# Patient Record
Sex: Male | Born: 1965 | Hispanic: No | Marital: Married | Smoking: Current some day smoker
Health system: Southern US, Community
[De-identification: ages and names within clinical notes are randomized; demographics above are authoritative.]

## PROBLEM LIST (undated history)

## (undated) DIAGNOSIS — I10 Essential (primary) hypertension: Secondary | ICD-10-CM

## (undated) HISTORY — DX: Essential (primary) hypertension: I10

---

## 2005-07-24 ENCOUNTER — Ambulatory Visit: Payer: Self-pay | Admitting: *Deleted

## 2005-07-26 ENCOUNTER — Encounter (HOSPITAL_COMMUNITY): Admission: RE | Admit: 2005-07-26 | Discharge: 2005-08-25 | Payer: Self-pay | Admitting: *Deleted

## 2005-07-26 ENCOUNTER — Ambulatory Visit: Payer: Self-pay | Admitting: Cardiology

## 2005-09-18 ENCOUNTER — Ambulatory Visit (HOSPITAL_COMMUNITY): Admission: RE | Admit: 2005-09-18 | Discharge: 2005-09-18 | Payer: Self-pay | Admitting: Pulmonary Disease

## 2007-02-17 ENCOUNTER — Ambulatory Visit: Payer: Self-pay | Admitting: Cardiology

## 2007-02-17 LAB — CONVERTED CEMR LAB
Chloride: 105 meq/L (ref 96–112)
Creatinine, Ser: 1.1 mg/dL (ref 0.4–1.5)
Eosinophils Absolute: 0.3 10*3/uL (ref 0.0–0.6)
MCHC: 33.9 g/dL (ref 30.0–36.0)
MCV: 88.1 fL (ref 78.0–100.0)
Monocytes Absolute: 0.7 10*3/uL (ref 0.2–0.7)
Monocytes Relative: 9 % (ref 3.0–11.0)
Neutro Abs: 3.8 10*3/uL (ref 1.4–7.7)
Potassium: 3.9 meq/L (ref 3.5–5.1)
RBC: 5.07 M/uL (ref 4.22–5.81)
Sodium: 139 meq/L (ref 135–145)
aPTT: 28.5 s (ref 21.7–29.8)

## 2007-02-20 ENCOUNTER — Inpatient Hospital Stay (HOSPITAL_BASED_OUTPATIENT_CLINIC_OR_DEPARTMENT_OTHER): Admission: RE | Admit: 2007-02-20 | Discharge: 2007-02-20 | Payer: Self-pay | Admitting: Cardiology

## 2007-02-20 ENCOUNTER — Ambulatory Visit: Payer: Self-pay | Admitting: Cardiology

## 2007-03-03 ENCOUNTER — Ambulatory Visit: Payer: Self-pay | Admitting: Cardiology

## 2007-03-03 LAB — CONVERTED CEMR LAB
ALT: 63 units/L — ABNORMAL HIGH (ref 0–53)
AST: 32 units/L (ref 0–37)
Alkaline Phosphatase: 58 units/L (ref 39–117)
Bilirubin, Direct: 0.1 mg/dL (ref 0.0–0.3)
LDL Cholesterol: 69 mg/dL (ref 0–99)
Total CHOL/HDL Ratio: 4.8
Total CK: 335 units/L (ref 7–195)
Total Protein: 7.4 g/dL (ref 6.0–8.3)
Triglycerides: 180 mg/dL — ABNORMAL HIGH (ref 0–149)
VLDL: 36 mg/dL (ref 0–40)

## 2008-10-17 ENCOUNTER — Encounter: Admission: RE | Admit: 2008-10-17 | Discharge: 2008-10-17 | Payer: Self-pay | Admitting: Family Medicine

## 2009-03-28 ENCOUNTER — Ambulatory Visit (HOSPITAL_COMMUNITY): Payer: Self-pay | Admitting: Psychiatry

## 2009-09-21 ENCOUNTER — Ambulatory Visit: Payer: Self-pay | Admitting: Psychiatry

## 2009-09-21 ENCOUNTER — Inpatient Hospital Stay (HOSPITAL_COMMUNITY): Admission: RE | Admit: 2009-09-21 | Discharge: 2009-09-25 | Payer: Self-pay | Admitting: Psychiatry

## 2010-05-04 LAB — DIFFERENTIAL
Basophils Absolute: 0.1 10*3/uL (ref 0.0–0.1)
Lymphs Abs: 2.8 10*3/uL (ref 0.7–4.0)
Neutro Abs: 3.6 10*3/uL (ref 1.7–7.7)

## 2010-05-04 LAB — COMPREHENSIVE METABOLIC PANEL
ALT: 49 U/L (ref 0–53)
AST: 35 U/L (ref 0–37)
Albumin: 4.5 g/dL (ref 3.5–5.2)
Chloride: 104 mEq/L (ref 96–112)
Creatinine, Ser: 1.16 mg/dL (ref 0.4–1.5)
GFR calc Af Amer: >60 mL/min (ref >60)
Sodium: 138 mEq/L (ref 135–145)
Total Protein: 7.3 g/dL (ref 6.0–8.3)

## 2010-05-04 LAB — TSH: TSH: 3.507 u[IU]/mL (ref 0.350–4.500)

## 2010-05-04 LAB — CBC
Hemoglobin: 15.2 g/dL (ref 13.0–17.0)
MCHC: 34.6 g/dL (ref 30.0–36.0)
MCV: 86.6 fL (ref 78.0–100.0)
Platelets: 253 10*3/uL (ref 150–400)
WBC: 7.6 10*3/uL (ref 4.0–10.5)

## 2010-07-03 NOTE — Assessment & Plan Note (Signed)
[facility]                            CARDIOLOGY OFFICE NOTE   TIMM, BONENBERGER                           MRN:          [id number]  DATE:03/03/2007                            DOB:          Jan 24, 1966    Mr. Placeres returns today after having a cardiac catheterization.  His  catheterization showed normal coronary arteries and normal left  ventricular function.   He has not eaten today and would like his lipids checked.  He was told  several months ago that his CPK enzyme was up, but he had a fall at a  friend's house.  He also was told that his liver enzymes were up of bit.   CURRENT MEDICATIONS:  1. Lipitor 40 mg q.h.s.  2. Norvasc 10 mg a day.  3. Allopurinol 300 mg a day.  4. Multivitamins.  5. Vitamin D.  6. Lexapro 10 mg a day.  (He stopped his aspirin appropriately.)   PHYSICAL EXAMINATION:  VITAL SIGNS:  His blood pressure is good at  126/85, his pulse is 63 and regular, his weight is 197.  GROIN:  His right groin is stable.  He has a small knot there but no  bruit.  EXTREMITIES:  Distal pulses are intact.   ASSESSMENT/PLAN:  I have made him a copy of his cath report and his  blood work pre cath.  I have also given him primary prevention  guidelines.  Will check his blood work today and will give him a call.  I will see him back in a year p.r.n.     Thomas C. Daleen Squibb, MD, [facility]  Electronically Signed    TCW/MedQ  DD: 03/03/2007  DT: 03/03/2007  Job #: [id number]   cc:   Lionel December, M.D.

## 2010-07-03 NOTE — Cardiovascular Report (Signed)
NAMEDUSTEN, ELLINWOOD NO.:  [id number]   MEDICAL RECORD NO.:  [id number]          PATIENT TYPE:  OIB   LOCATION:  1961                         FACILITY:  [facility]   PHYSICIAN:  Rollene Rotunda, MD, FACCDATE OF BIRTH:  05-10-1965   DATE OF PROCEDURE:  DATE OF DISCHARGE:                            CARDIAC CATHETERIZATION   PRIMARY DOCTOR:  Lionel December, M.D.   CARDIOLOGIST:  Jesse Sans. Wall, MD, [facility]   PROCEDURE:  Left heart catheterization/coronary arteriography.   INDICATIONS:  A patient with chest discomfort and a Cardiolite  suggesting anterior wall ischemia.   PROCEDURE NOTE:  Left heart catheterization performed via right femoral  artery.  The artery is cannulated using anterior wall puncture.  A #4-  [place] arterial sheath was inserted via the Seldinger technique.  Preformed Judkins and pigtail catheter utilized.  The patient tolerated  procedure well and left the lab in stable condition.   RESULTS:  Hemodynamics:  LV 123/15, AO 121/97.  Coronaries:  Left main  was normal.  The LAD wrapped the apex but was otherwise normal.  First  through fourth diagonals were small and normal.  The circumflex was a  dominant vessel.  OM-1 was large branching and normal.  Posterolateral  was large branching and normal.  The PDA was moderate-sized and normal.  The right coronary artery is nondominant.  It was small and normal.  Left ventriculogram:  The left ventriculogram was obtained in the RAO  projection.  The EF 65% with normal wall motion.   CONCLUSION:  Normal coronaries.  Normal left ventricular function.   PLAN:  No further cardiac workup is suggested.  Continue with primary  risk reduction.      Rollene Rotunda, MD, [facility]  Electronically Signed     JH/MEDQ  D:  02/20/2007  T:  02/20/2007  Job:  [id number]   cc:   Lionel December, M.D.  Thomas C. Wall, MD, [facility]

## 2010-07-03 NOTE — Assessment & Plan Note (Signed)
[facility]                            CARDIOLOGY OFFICE NOTE   MOHSIN, CRUM                           MRN:          [id number]  DATE:02/18/2007                            DOB:          1965/09/07    I was asked by Dr. Lionel December to see his brother-in-law, Symon Norwood,  today for chest pain and positive stress nuclear study.   HISTORY OF PRESENT ILLNESS:  Mr. Patrick Duffy is a delightful 45 year old [place]  male who I have known in the past when I took care of his father.  His  father died of heart failure several years ago in [place].   At that time he was under a lot of stress with their family business,  [address], out of [employer].  He was in [place] for a few  months and smoked for those months.  Other than that he has never  smoked.  During that time he noticed at times a chest fullness or  heaviness and tension-like.  It went into his shoulders and his arms.  It is not exertion related.   He told one of his customers from [place] about this.  They were  in [place] at the time, and he saw Dr. Joanie Coddington.  He performed an  exercise rest stress Myoview study.   He exercised for a total of 10 minutes and 30 seconds with a heart rate  of 161 which was 88% of his age predicted maximum rate.  His MET level  achieved was 12.5.  He had no chest pain or ST segment changes.   His EF was 71% with exertion 60% at rest.   His stress images showed myocardial ischemia with redistribution in the  proximal and midseptum suggestive of myocardial ischemia.   His cardiac risk factors include hypertension, hyperlipidemia.  His  father also had myocardial infarction in his early 55's.   CURRENT MEDICATIONS:  1. Lipitor 40 mg q.h.s.  2. Norvasc 10 mg a day.  3. Allopurinol 300 mg a day.  4. Aspirin 81 mg a day.  5. Multivitamin daily.  6. Vitamin D 1000 mg a day.  7. Lexapro 10 mg a day.   A couple years ago he was having some shortness of  breath.  He had a  chest CT which showed no evidence of pulmonary emboli and no acute  thoracic abnormalities.  He also had pulmonary function studies which  were normal with Dr. Shaune Pollack in [place].  At that time he also had  a stress Myoview which was negative for ischemia.  His ejection fraction  was 62%.  He did have some apical thinning, but definitely no ischemia.   ALLERGIES:  He has no known drug allergies.  He has no contrast  allergies.   HABITS:  He does not drink.  He does not exercise on a regular basis.   PAST SURGICAL HISTORY:  He has had no surgeries.   FAMILY HISTORY:  As above.   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  His family has been in  the business for years.   He is single, has no children.   PHYSICAL EXAMINATION:  GENERAL:  He is a very delightful young man in no  acute distress.  VITAL SIGNS:  Blood pressure 112/70, pulse 62 and regular, 5 feet 9,  weighs 198 pounds.  HEENT:  Normocephalic, atraumatic.  PERRLA, extraocular movements  intact.  Sclerae are slightly injected.  Facial symmetry is normal.  Dentition is satisfactory.  NECK:  Carotid upstrokes were equal bilaterally without bruits, no JVD.  Thyroid is not enlarged.  Trachea is midline.  LUNGS:  Clear to auscultation and percussion.  HEART:  Reveals a nondisplaced PMI.  Normal S1, S2 without murmur, rub  or gallop.  ABDOMEN:  Soft with good bowel sounds.  No midline or flank bruit.  No  organomegaly.  EXTREMITIES:  No cyanosis, clubbing or edema.  Pulses are intact.  NEUROLOGICAL:  Intact.  SKIN:  Unremarkable.   His electrocardiogram is normal.   ASSESSMENT:  1. Chest tension.  It radiates into both shoulders and arms.  He does      have a positive stress Myoview that is different from a stress      Myoview from June 2007.  2. Hypertension.  3. Hyperlipidemia.  4. Family history of coronary  disease.   I have had a long talk with Wenceslao today.  I have recommended a diagnostic  heart catheterization in the [facility] lab.  Indications, risks, potential  benefits were discussed thoroughly.  He agrees to proceed.  He has been  arranged to have this done with Dr. Angelina Sheriff on Friday, February 19, 2006.   I have asked him to take 81 mg of aspirin daily until his  catheterization.     Thomas C. Daleen Squibb, MD, [facility]  Electronically Signed    TCW/MedQ  DD: 02/17/2007  DT: 02/18/2007  Job #: [id number]   cc:   Lionel December, M.D.

## 2010-07-06 NOTE — Procedures (Signed)
Patrick Duffy, Patrick Duffy                    ACCOUNT NO.:  [id number]   MEDICAL RECORD NO.:  [id number]          PATIENT TYPE:  OUT   LOCATION:  RAD                           FACILITY:  [facility]   PHYSICIAN:  Edward L. Juanetta Gosling, M.D.DATE OF BIRTH:  [birth year]   DATE OF PROCEDURE:  09/18/2005  DATE OF DISCHARGE:                              PULMONARY FUNCTION TEST   1.  Spirometry with no ventilatory defect.  2.  Lung volumes are normal.  3.  DLCO is mildly reduced.  4.  Blood gases are normal.      Edward L. Juanetta Gosling, M.D.  Electronically Signed     ELH/MEDQ  D:  09/18/2005  T:  09/19/2005  Job:  [id number]

## 2011-04-18 ENCOUNTER — Other Ambulatory Visit: Payer: Self-pay | Admitting: Orthopedic Surgery

## 2011-04-18 DIAGNOSIS — M545 Low back pain, unspecified: Secondary | ICD-10-CM

## 2011-04-19 ENCOUNTER — Other Ambulatory Visit: Payer: Self-pay

## 2011-04-20 ENCOUNTER — Inpatient Hospital Stay: Admission: RE | Admit: 2011-04-20 | Payer: Self-pay | Source: Ambulatory Visit

## 2012-03-23 ENCOUNTER — Ambulatory Visit (INDEPENDENT_AMBULATORY_CARE_PROVIDER_SITE_OTHER): Payer: 59 | Admitting: Psychiatry

## 2012-03-23 ENCOUNTER — Encounter (HOSPITAL_COMMUNITY): Payer: Self-pay | Admitting: Psychiatry

## 2012-03-23 VITALS — BP 134/86 | HR 61 | Wt 194.3 lb

## 2012-03-23 DIAGNOSIS — F419 Anxiety disorder, unspecified: Secondary | ICD-10-CM

## 2012-03-23 DIAGNOSIS — F411 Generalized anxiety disorder: Secondary | ICD-10-CM

## 2012-03-23 MED ORDER — HYDROXYZINE PAMOATE 25 MG PO CAPS: 25.0000 mg | ORAL_CAPSULE | ORAL | Status: DC | PRN

## 2012-03-23 MED ORDER — BUPROPION HCL 75 MG PO TABS: 75.0000 mg | ORAL_TABLET | Freq: Every day | ORAL | Status: DC

## 2012-03-23 MED ORDER — ALPRAZOLAM 0.5 MG PO TABS: 0.5000 mg | ORAL_TABLET | ORAL | Status: AC | PRN
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
## 2017-03-12 ENCOUNTER — Encounter (HOSPITAL_COMMUNITY): Payer: Self-pay | Admitting: Psychiatry

## 2017-03-12 ENCOUNTER — Ambulatory Visit (INDEPENDENT_AMBULATORY_CARE_PROVIDER_SITE_OTHER): Payer: Self-pay | Admitting: Psychiatry

## 2017-03-12 VITALS — BP 136/80 | HR 90 | Ht 69.5 in | Wt 197.0 lb

## 2017-03-12 DIAGNOSIS — F1394 Sedative, hypnotic or anxiolytic use, unspecified with sedative, hypnotic or anxiolytic-induced mood disorder: Secondary | ICD-10-CM

## 2017-03-12 DIAGNOSIS — F419 Anxiety disorder, unspecified: Secondary | ICD-10-CM

## 2017-03-12 DIAGNOSIS — F330 Major depressive disorder, recurrent, mild: Secondary | ICD-10-CM

## 2017-03-12 DIAGNOSIS — Z79899 Other long term (current) drug therapy: Secondary | ICD-10-CM

## 2017-03-12 MED ORDER — DULOXETINE HCL 30 MG PO CPEP: 30.0000 mg | ORAL_CAPSULE | Freq: Every day | ORAL | 0 refills | Status: DC

## 2017-03-12 MED ORDER — QUETIAPINE FUMARATE 100 MG PO TABS: ORAL_TABLET | ORAL | 1 refills | Status: DC
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
## 2017-04-07 ENCOUNTER — Other Ambulatory Visit (HOSPITAL_COMMUNITY): Payer: Self-pay | Admitting: Psychiatry

## 2017-04-07 DIAGNOSIS — F330 Major depressive disorder, recurrent, mild: Secondary | ICD-10-CM

## 2017-04-22 ENCOUNTER — Ambulatory Visit (HOSPITAL_COMMUNITY): Payer: Self-pay | Admitting: Psychiatry

## 2017-06-02 ENCOUNTER — Other Ambulatory Visit (HOSPITAL_COMMUNITY): Payer: Self-pay | Admitting: Psychiatry

## 2017-06-02 DIAGNOSIS — F330 Major depressive disorder, recurrent, mild: Secondary | ICD-10-CM

## 2017-07-06 ENCOUNTER — Other Ambulatory Visit (HOSPITAL_COMMUNITY): Payer: Self-pay | Admitting: Psychiatry

## 2017-07-06 DIAGNOSIS — F330 Major depressive disorder, recurrent, mild: Secondary | ICD-10-CM

## 2017-07-07 NOTE — Telephone Encounter (Signed)
Patient needs to be seen.

## 2017-11-03 ENCOUNTER — Telehealth (HOSPITAL_COMMUNITY): Payer: Self-pay | Admitting: Psychiatry

## 2017-11-03 DIAGNOSIS — F330 Major depressive disorder, recurrent, mild: Secondary | ICD-10-CM

## 2017-11-03 MED ORDER — TRAZODONE HCL 50 MG PO TABS: 50.0000 mg | ORAL_TABLET | Freq: Every day | ORAL | 0 refills | Status: DC

## 2017-11-03 MED ORDER — DULOXETINE HCL 30 MG PO CPEP: 30.0000 mg | ORAL_CAPSULE | Freq: Every day | ORAL | 0 refills | Status: DC

## 2017-11-03 NOTE — Telephone Encounter (Signed)
Patient called and mentioned that he is out of Cymbalta I can he need a new refill.  He is no longer taking Seroquel because it is causing cramping in his legs.  I will discontinue Seroquel.  We will start low-dose trazodone.  I will refill Cymbalta.  Recommended to schedule appointment for future refills.

## 2017-11-04 NOTE — Telephone Encounter (Signed)
Called patient and lvm letting him know to call me back for an appointment. Patient was given my desk number to call back

## 2017-12-09 ENCOUNTER — Telehealth (HOSPITAL_COMMUNITY): Payer: Self-pay | Admitting: Psychiatry

## 2018-07-21 ENCOUNTER — Other Ambulatory Visit (HOSPITAL_COMMUNITY): Payer: Self-pay | Admitting: Psychiatry

## 2018-07-22 ENCOUNTER — Other Ambulatory Visit: Payer: Self-pay

## 2018-07-22 ENCOUNTER — Ambulatory Visit (INDEPENDENT_AMBULATORY_CARE_PROVIDER_SITE_OTHER): Payer: Self-pay | Admitting: Psychiatry

## 2018-07-22 ENCOUNTER — Encounter (HOSPITAL_COMMUNITY): Payer: Self-pay | Admitting: Psychiatry

## 2018-07-22 DIAGNOSIS — F419 Anxiety disorder, unspecified: Secondary | ICD-10-CM

## 2018-07-22 DIAGNOSIS — F330 Major depressive disorder, recurrent, mild: Secondary | ICD-10-CM

## 2018-07-22 NOTE — Progress Notes (Signed)
Virtual Visit via Telephone Note  I connected with Patrick Duffy on 07/22/18 at  3:20 PM EDT by telephone and verified that I am speaking with the correct person using two identifiers.   I discussed the limitations, risks, security and privacy concerns of performing an evaluation and management service by telephone and the availability of in person appointments. I also discussed with the patient that there may be a patient responsible charge related to this service. The patient expressed understanding and agreed to proceed.   History of Present Illness: Patient was evaluated by phone session.  He was last seen in January 2019.  He endorsed that he stop abusing Ambien and not taking Cymbalta and Seroquel.  He went to [place] for a family wedding 4 months ago and he was doing fine until he started feeling depressed and anxious again.  He was experiencing poor sleep and anxiety.  He also admitted marital stress and recently seen by primary care physician who prescribed him Zoloft.  Due to COVID-19 he is unable to get his prescription from his physician and like to continue care in this office.  Patient feels proud that he is not taking any hypnotics.  He uses tai chi and exercise to calm himself but like to have Zoloft which is working well for him.  He reported no side effects of the medication.  Denies any feeling of hopelessness or worthlessness.  He denies any agitation, anger mania or any psychosis.  Patient told he is busy at his work and did not have enough time for counseling.  He decided to have his wife not stay with his mother and he had a separate home for his wife.  Patient told things are going so-so but sometimes struggle in his marriage.  Patient told his appetite is okay.  He denies any feeling of hopelessness or worthlessness.  He denies any suicidal thoughts.  He like to have his Zoloft resumed from this office.      Past Psychiatric History: Reviewed. Patient has one psychiatric admission  at behavioral Center in August 2011. At that time patient was using sedated hypnotics to deal with his depression anxiety and insomnia. He had tried Seroquel, doxepin , trazodone , Cymbalta , Wellbutrin, lorazepam, trazodone, Ambien in the past. 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   Is a history of sleepwalking with Ambien and also questionable history of seizures due to withdrawal of benzodiazepine.  Patient denies any history of suicidal attempt, psychosis or mania.     Psychiatric Specialty Exam: Physical Exam  ROS  There were no vitals taken for this visit.There is no height or weight on file to calculate BMI.  General Appearance: NA  Eye Contact:  NA  Speech:  Clear and Coherent  Volume:  Normal  Mood:  Angry and Dysphoric  Affect:  NA  Thought Process:  Goal Directed  Orientation:  Full (Time, Place, and Person)  Thought Content:  Rumination  Suicidal Thoughts:  No  Homicidal Thoughts:  No  Memory:  Immediate;   Good Recent;   Good Remote;   Good  Judgement:  Good  Insight:  Good  Psychomotor Activity:  Normal  Concentration:  Concentration: Good and Attention Span: Good  Recall:  Good  Fund of Knowledge:  Good  Language:  Good  Akathisia:  No  Handed:  Right  AIMS (if indicated):  Assets:  Communication Skills Desire for [place] Talents/Skills Transportation  ADL's:  Intact  Cognition:  WNL  Sleep:   Fair    Assessment and Plan: Major depressive disorder, recurrent.  Anxiety disorder.  Hypnotic abuse in remission.  Patient is no longer taking Cymbalta Seroquel and Ambien.  He had started taking Zoloft which is working very well for him.  He admitted some marital stress but he is able to handle fairly well.  He started doing exercise, running and tai chi which helps his mood.  He like to resume  Zoloft from this office.  I discussed medication side effects and benefits.  He does not have any tremors, shakes or any EPS.  I will continue Zoloft 100 mg daily.  Recommended to call us back if is any question or any concern.  Follow-up in 4 to 6 months.  Follow Up Instructions:    I discussed the assessment and treatment plan with the patient. The patient was provided an opportunity to ask questions and all were answered. The patient agreed with the plan and demonstrated an understanding of the instructions.   The patient was advised to call back or seek an in-person evaluation if the symptoms worsen or if the condition fails to improve as anticipated.  I provided 20 minutes of non-face-to-face time during this encounter.   Kathlee Nations, MD

## 2018-07-23 MED ORDER — SERTRALINE HCL 50 MG PO TABS: 50.0000 mg | ORAL_TABLET | Freq: Every day | ORAL | 0 refills | Status: DC

## 2018-07-23 NOTE — Addendum Note (Signed)
Addended by: Kathryne Sharper T on: 07/23/2018 03:53 PM   Modules accepted: Orders

## 2018-11-15 ENCOUNTER — Other Ambulatory Visit (HOSPITAL_COMMUNITY): Payer: Self-pay | Admitting: Psychiatry

## 2018-11-15 DIAGNOSIS — F330 Major depressive disorder, recurrent, mild: Secondary | ICD-10-CM

## 2019-01-21 ENCOUNTER — Ambulatory Visit (HOSPITAL_COMMUNITY): Payer: Self-pay | Admitting: Psychiatry

## 2019-01-21 ENCOUNTER — Other Ambulatory Visit: Payer: Self-pay

## 2019-03-12 ENCOUNTER — Other Ambulatory Visit (HOSPITAL_COMMUNITY): Payer: Self-pay | Admitting: Psychiatry

## 2019-03-12 DIAGNOSIS — F330 Major depressive disorder, recurrent, mild: Secondary | ICD-10-CM

## 2019-03-12 MED ORDER — SERTRALINE HCL 50 MG PO TABS: 50.0000 mg | ORAL_TABLET | Freq: Every day | ORAL | 0 refills | Status: DC

## 2019-04-07 ENCOUNTER — Other Ambulatory Visit (HOSPITAL_COMMUNITY): Payer: Self-pay | Admitting: Psychiatry

## 2019-04-07 DIAGNOSIS — F330 Major depressive disorder, recurrent, mild: Secondary | ICD-10-CM

## 2019-04-14 ENCOUNTER — Emergency Department (HOSPITAL_COMMUNITY): Payer: 59

## 2019-04-14 ENCOUNTER — Other Ambulatory Visit: Payer: Self-pay

## 2019-04-14 ENCOUNTER — Encounter (HOSPITAL_COMMUNITY): Payer: Self-pay | Admitting: *Deleted

## 2019-04-14 ENCOUNTER — Inpatient Hospital Stay (HOSPITAL_COMMUNITY)
Admission: EM | Admit: 2019-04-14 | Discharge: 2019-04-15 | DRG: 123 | Disposition: A | Discharge status: Home / Self Care | Payer: 59 | Attending: Internal Medicine | Admitting: Internal Medicine

## 2019-04-14 DIAGNOSIS — Z20822 Contact with and (suspected) exposure to covid-19: Secondary | ICD-10-CM | POA: Diagnosis present

## 2019-04-14 DIAGNOSIS — Z720 Tobacco use: Secondary | ICD-10-CM

## 2019-04-14 DIAGNOSIS — F17210 Nicotine dependence, cigarettes, uncomplicated: Secondary | ICD-10-CM | POA: Diagnosis present

## 2019-04-14 DIAGNOSIS — H469 Unspecified optic neuritis: Principal | ICD-10-CM | POA: Diagnosis present

## 2019-04-14 DIAGNOSIS — E876 Hypokalemia: Secondary | ICD-10-CM | POA: Diagnosis not present

## 2019-04-14 LAB — CBC
HCT: 44.2 % (ref 39.0–52.0)
Hemoglobin: 15 g/dL (ref 13.0–17.0)
MCH: 28 pg (ref 26.0–34.0)
MCHC: 33.9 g/dL (ref 30.0–36.0)
MCV: 82.6 fL (ref 80.0–100.0)
Platelets: 310 10*3/uL (ref 150–400)
RBC: 5.35 MIL/uL (ref 4.22–5.81)
RDW: 16 % — ABNORMAL HIGH (ref 11.5–15.5)
WBC: 7.8 10*3/uL (ref 4.0–10.5)
nRBC: 0 % (ref 0.0–0.2)

## 2019-04-14 LAB — BASIC METABOLIC PANEL
Anion gap: 11 (ref 5–15)
BUN: 17 mg/dL (ref 6–20)
CO2: 23 mmol/L (ref 22–32)
Calcium: 9.1 mg/dL (ref 8.9–10.3)
Chloride: 107 mmol/L (ref 98–111)
Creatinine, Ser: 1.28 mg/dL — ABNORMAL HIGH (ref 0.61–1.24)
GFR calc Af Amer: >60 mL/min (ref >60)
GFR calc non Af Amer: >60 mL/min (ref >60)
Glucose, Bld: 97 mg/dL (ref 70–99)
Potassium: 3.4 mmol/L — ABNORMAL LOW (ref 3.5–5.1)
Sodium: 141 mmol/L (ref 135–145)

## 2019-04-14 MED ORDER — ACETAMINOPHEN 650 MG RE SUPP: 650.0000 mg | Freq: Four times a day (QID) | RECTAL | Status: DC | PRN

## 2019-04-14 MED ORDER — LORAZEPAM 2 MG/ML IJ SOLN
1.0000 mg | Freq: Once | INTRAMUSCULAR | Status: AC | PRN
  Administered 2019-04-14: 19:00:00 1 mg via INTRAVENOUS
  Filled 2019-04-14: qty 1

## 2019-04-14 MED ORDER — PANTOPRAZOLE SODIUM 40 MG PO TBEC
40.0000 mg | DELAYED_RELEASE_TABLET | Freq: Every day | ORAL | Status: DC
  Administered 2019-04-15 (×2): 40 mg via ORAL
  Filled 2019-04-14 (×2): qty 1

## 2019-04-14 MED ORDER — ONDANSETRON HCL 4 MG/2ML IJ SOLN: 4.0000 mg | Freq: Four times a day (QID) | INTRAMUSCULAR | Status: DC | PRN

## 2019-04-14 MED ORDER — ONDANSETRON HCL 4 MG PO TABS: 4.0000 mg | ORAL_TABLET | Freq: Four times a day (QID) | ORAL | Status: DC | PRN

## 2019-04-14 MED ORDER — SODIUM CHLORIDE 0.9 % IV SOLN: 1000.0000 mg | Freq: Four times a day (QID) | INTRAVENOUS | Status: DC

## 2019-04-14 MED ORDER — SODIUM CHLORIDE 0.9 % IV SOLN
1000.0000 mg | Freq: Every day | INTRAVENOUS | Status: DC
  Administered 2019-04-15: 1000 mg via INTRAVENOUS
  Filled 2019-04-14 (×3): qty 8

## 2019-04-14 MED ORDER — ACETAMINOPHEN 325 MG PO TABS
650.0000 mg | ORAL_TABLET | Freq: Four times a day (QID) | ORAL | Status: DC | PRN
  Administered 2019-04-15: 650 mg via ORAL
  Filled 2019-04-14: qty 2

## 2019-04-14 MED ORDER — GADOBUTROL 1 MMOL/ML IV SOLN
8.0000 mL | Freq: Once | INTRAVENOUS | Status: AC | PRN
  Administered 2019-04-14: 8 mL via INTRAVENOUS

## 2019-04-14 MED ORDER — LORAZEPAM 2 MG/ML IJ SOLN: 1.0000 mg | Freq: Once | INTRAMUSCULAR | Status: DC

## 2019-04-14 NOTE — Consult Note (Signed)
NEURO HOSPITALIST CONSULT NOTE   Requestig physician: Dr. Toniann Fail  Reason for Consult: Acute right optic neuritis.   History obtained from:   Patient and Chart     HPI:                                                                                                                                          Patrick Duffy is an 54 y.o. male who presents with acute vision loss involving his right eye, which started on Monday. He saw Dr. Grayling Congress who noted right optic nerve swelling on retinal exam. Dr. Grayling Congress was concerned about optic neuritis as the etiology, as well as possible new diagnosis of MS. The patient was sent to [facility] for an MRI of brain/orbits.   The patient endorsed a waxing/waning component to his right monocular vision loss - he was able to distinguish the face of the EDP with his right eye during exam.   He has received his first dose of IV methylprednisolone. On bedside interview by Neurology, the patient states that his right eye vision is at about 5% of his baseline normal vision. There are patchy areas of worse vision loss, but the vision in the entire right eye is severely affected. He has some retroorbital right eye pain that is present at rest but also worsened with movement. Left eye vision is normal.   MRI brain and orbits with and without contrast: 1. Abnormal enhancement of the right greater than left optic nerves consistent with optic neuritis. 2. Unremarkable appearance of the brain.   Past Medical History:  Diagnosis Date  . HTN (hypertension)     History reviewed. No pertinent surgical history.  No family history on file.            Social History:  reports that he has been smoking cigarettes. He has never used smokeless tobacco. He reports that he does not drink alcohol or use drugs.  No Known Allergies  HOME MEDICATIONS:                                                                                                                       No current facility-administered medications on file prior to encounter.   Current Outpatient Medications on File Prior to Encounter  Medication Sig Dispense Refill  . acetaminophen (TYLENOL) 500 MG tablet Take 500-1,000 mg by mouth every 8 (eight) hours as needed for mild pain.    Marland Kitchen aspirin-sod bicarb-citric acid (ALKA-SELTZER) 325 MG TBEF tablet Take 325 mg by mouth every 6 (six) hours as needed (for cold symptoms).    . hydrOXYzine (ATARAX/VISTARIL) 25 MG tablet Take 25-50 mg by mouth at bedtime as needed for itching.     . triamcinolone cream (KENALOG) 0.1 % Apply 1 application topically 2 (two) times daily as needed (until rash resolves- apply sparingly and rub in well).     . sertraline (ZOLOFT) 50 MG tablet Take 1 tablet (50 mg total) by mouth daily. 30 tablet 0     ROS:                                                                                                                                       No fevers or chills, weakness or numbness. Other symptoms as per HPI with comprehensive ROS otherwise negative.     Height 5\' 9"  (1.753 m), weight 88.5 kg.   General Examination:                                                                                                       Physical Exam  HEENT-  Moody/AT. No external eye abnormalities noted.  Lungs- Respirations unlabored Extremities- No edema  Neurological Examination Mental Status: Awake and alert. Pleasant and cooperative. Speech fluent without evidence of aphasia.  Able to follow all commands and anwerwithout difficulty. Cranial Nerves: II: Vision in right eye is to gross shapes and motion only. Left eye with baseline acuity. Right RAPD is noted. Left pupil normal light reflex. Visual fields intact to moving fingers in all 4 quadrants of right and left eye, each tested individually.  III,IV, VI: No ptosis. EOMI. No nystagmus.  V,VII: Smile symmetric, facial temp sensation equal bilaterally VIII: hearing intact to  conversation IX,X: No hypophonia XI: Symmetric shoulder shrug XII: midline tongue extension Motor: Right : Upper extremity   5/5    Left:     Upper extremity   5/5  Lower extremity   5/5     Lower extremity   5/5 Normal tone throughout; no atrophy noted No pronator drift Sensory: Temp and light touch intact throughout, bilaterally. No extinction to DSS Deep Tendon Reflexes: 1+ and symmetric throughout Plantars: Right: downgoing   Left: downgoing Cerebellar: No ataxia with FNF bilaterally  Gait: Deferred  Lab Results: Basic Metabolic Panel: Recent Labs  Lab 04/14/19 1810  NA 141  K 3.4*  CL 107  CO2 23  GLUCOSE 97  BUN 17  CREATININE 1.28*  CALCIUM 9.1    CBC: Recent Labs  Lab 04/14/19 1810  WBC 7.8  HGB 15.0  HCT 44.2  MCV 82.6  PLT 310    Cardiac Enzymes: No results for input(s): CKTOTAL, CKMB, CKMBINDEX, TROPONINI in the last 168 hours.  Lipid Panel: No results for input(s): CHOL, TRIG, HDL, CHOLHDL, VLDL, LDLCALC in the last 168 hours.  Imaging: MR Brain W and Wo Contrast  Result Date: 04/14/2019 CLINICAL DATA:  Right eye vision loss with optic nerve swelling. Concern for optic neuritis. EXAM: MRI HEAD AND ORBITS WITHOUT AND WITH CONTRAST TECHNIQUE: Multiplanar, multiecho pulse sequences of the brain and surrounding structures were obtained without and with intravenous contrast. Multiplanar, multiecho pulse sequences of the orbits and surrounding structures were obtained including fat saturation techniques, before and after intravenous contrast administration. CONTRAST:  95mL GADAVIST GADOBUTROL 1 MMOL/ML IV SOLN COMPARISON:  None. FINDINGS: MRI HEAD FINDINGS Brain: There is no evidence of acute infarct, intracranial hemorrhage, mass, midline shift, or extra-axial fluid collection. The ventricles and sulci are normal. The brain is normal in signal. No abnormal enhancement is identified. Vascular: Major intracranial vascular flow voids are preserved. Normal  enhancement of the dural venous sinuses. Skull and upper cervical spine: Unremarkable bone marrow signal. Other: None. MRI ORBITS FINDINGS Orbits: Image quality is degraded by motion artifact, however there is abnormal enhancement of much of the intraorbital portion of the right optic nerve as well as abnormal enhancement surrounding the nerve, and there is also abnormal enhancement of the anterior aspect of the left intraorbital optic nerve. The globes are symmetric and appear intact. No orbital mass is identified. The extraocular muscles and lacrimal glands are unremarkable. Visualized sinuses: Paranasal sinuses and mastoid air cells are clear. Soft tissues: None. Limited intracranial: Unremarkable appearance of the optic chiasm, cavernous sinuses, and pituitary gland. IMPRESSION: 1. Abnormal enhancement of the right greater than left optic nerves consistent with optic neuritis. 2. Unremarkable appearance of the brain. Electronically Signed   By: Sebastian Ache M.D.   On: 04/14/2019 20:42   MR ORBITS W WO CONTRAST  Result Date: 04/14/2019 CLINICAL DATA:  Right eye vision loss with optic nerve swelling. Concern for optic neuritis. EXAM: MRI HEAD AND ORBITS WITHOUT AND WITH CONTRAST TECHNIQUE: Multiplanar, multiecho pulse sequences of the brain and surrounding structures were obtained without and with intravenous contrast. Multiplanar, multiecho pulse sequences of the orbits and surrounding structures were obtained including fat saturation techniques, before and after intravenous contrast administration. CONTRAST:  19mL GADAVIST GADOBUTROL 1 MMOL/ML IV SOLN COMPARISON:  None. FINDINGS: MRI HEAD FINDINGS Brain: There is no evidence of acute infarct, intracranial hemorrhage, mass, midline shift, or extra-axial fluid collection. The ventricles and sulci are normal. The brain is normal in signal. No abnormal enhancement is identified. Vascular: Major intracranial vascular flow voids are preserved. Normal enhancement of  the dural venous sinuses. Skull and upper cervical spine: Unremarkable bone marrow signal. Other: None. MRI ORBITS FINDINGS Orbits: Image quality is degraded by motion artifact, however there is abnormal enhancement of much of the intraorbital portion of the right optic nerve as well as abnormal enhancement surrounding the nerve, and there is also abnormal enhancement of the anterior aspect of the left intraorbital optic nerve. The globes are symmetric and appear intact. No orbital mass is identified. The extraocular muscles  and lacrimal glands are unremarkable. Visualized sinuses: Paranasal sinuses and mastoid air cells are clear. Soft tissues: None. Limited intracranial: Unremarkable appearance of the optic chiasm, cavernous sinuses, and pituitary gland. IMPRESSION: 1. Abnormal enhancement of the right greater than left optic nerves consistent with optic neuritis. 2. Unremarkable appearance of the brain. Electronically Signed   By: Logan Bores M.D.   On: 04/14/2019 20:42    Assessment: 54 year old male presenting with acute right optic neuritis.  1. Has had no prior episodes of optic neuritis or other neurological deficits that he can recall.  2. MRI orbits reveals abnormal enhancement of the right greater than left optic nerves consistent with optic neuritis.  3. MRI brain: Normal appearance of the brain 4. Neurological exam normal except for severely decreased visual acuity in right eye and right RAPD.   Recommendations: 1. Start IV methylprednisolone 1000 mg qd x 5 days.  2. MRI of cervical spine with and without contrast (ordered) 3. NMO antibody titer (ordered) 4. Will need outpatient Neurology follow up at discharge.   Electronically signed: Dr. Kerney Elbe 04/14/2019, 9:27 PM

## 2019-04-14 NOTE — ED Triage Notes (Signed)
Pt states loss of vision in R eye on Monday.  R eye optic nerve swelling noted by his MD.  Per Dr Consuello Masse, he was concerned for optic neuritis d/t possible new dx of MS.  He would like an MRI brain/orbit.

## 2019-04-14 NOTE — ED Notes (Signed)
Pt went to MRI.

## 2019-04-14 NOTE — H&P (Signed)
History and Physical    Patrick Duffy KDT:[id number] DOB: August 22, 1965 DOA: 04/14/2019  PCP: Patient, No Pcp Per   Patient coming from: Home.  Chief Complaint: Blurred vision of the right eye.  HPI: Patrick Duffy is a 54 y.o. male with no significant past medical history presents to the ER with complaint of blurred vision of the right eye.  Patient has been having blurred vision for the last 9 days had gone to ophthalmologist who referred to the ER concerning for optic neuritis.  Patient denies any tingling numbness or weakness of upper or lower extremities or any incontinence of bowel or urine.  Denies any headache.  Had some mild pain around the right eye.  ED Course: In the ER MRI of the brain and orbits were done which shows features consistent with optic neuritis.  On-call neurologist Dr. Otelia Limes was consulted patient started on IV methylprednisolone pulse dose.  Labs show mild hypokalemia.  Covid test was negative.  Review of Systems: As per HPI, rest all negative.   Past Medical History:  Diagnosis Date  . HTN (hypertension)     History reviewed. No pertinent surgical history.   reports that he has been smoking cigarettes. He has never used smokeless tobacco. He reports that he does not drink alcohol or use drugs.  Allergies  Allergen Reactions  . Trazodone And Nefazodone Other (See Comments)    Caused the patient to be groggy the next morning   . Other Dermatitis and Rash    DYES (clothing)    Family History  Family history unknown: Yes    Prior to Admission medications   Medication Sig Start Date End Date Taking? Authorizing Provider  acetaminophen (TYLENOL) 500 MG tablet Take 500-1,000 mg by mouth every 8 (eight) hours as needed for mild pain.   Yes [provider]  aspirin-sod bicarb-citric acid (ALKA-SELTZER) 325 MG TBEF tablet Take 325 mg by mouth every 6 (six) hours as needed (for cold symptoms).   Yes [provider]  hydrOXYzine (ATARAX/VISTARIL)  25 MG tablet Take 25-50 mg by mouth at bedtime as needed for itching.  03/14/18  Yes [provider]  triamcinolone cream (KENALOG) 0.1 % Apply 1 application topically 2 (two) times daily as needed (until rash resolves- apply sparingly and rub in well).  03/14/18  Yes [provider]  sertraline (ZOLOFT) 50 MG tablet Take 1 tablet (50 mg total) by mouth daily. 03/12/19   Arfeen, Phillips Grout, MD    Physical Exam: Constitutional: Moderately built and nourished. Vitals:   04/14/19 1744 04/14/19 1900 04/14/19 2045 04/14/19 2153  BP:  (!) 124/97 126/89 (!) 129/100  Pulse:  69 72 88  Resp:   19 19  Temp:    98.4 F (36.9 C)  TempSrc:    Oral  SpO2:  98% 97% 97%  Weight: 88.5 kg     Height: 5\' 9"  (1.753 m)      Eyes: Anicteric no pallor. ENMT: No discharge from the ears eyes nose or mouth. Neck: No masses.  No neck rigidity. Respiratory: No longer crepitations. Cardiovascular:   S1-S2 heard. Abdomen: Soft nontender bowel sounds present. Musculoskeletal: No edema. Skin: No rash. Neurologic: Alert awake oriented to time place and person.  Moves all extremities. Psychiatric: Appears normal.   Labs on Admission: I have personally reviewed following labs and imaging studies  CBC: Recent Labs  Lab 04/14/19 1810  WBC 7.8  HGB 15.0  HCT 44.2  MCV 82.6  PLT 310  Basic Metabolic Panel: Recent Labs  Lab 04/14/19 1810  NA 141  K 3.4*  CL 107  CO2 23  GLUCOSE 97  BUN 17  CREATININE 1.28*  CALCIUM 9.1   GFR: Estimated Creatinine Clearance: 74.3 mL/min (A) (by C-G formula based on SCr of 1.28 mg/dL (H)). Liver Function Tests: No results for input(s): AST, ALT, ALKPHOS, BILITOT, PROT, ALBUMIN in the last 168 hours. No results for input(s): LIPASE, AMYLASE in the last 168 hours. No results for input(s): AMMONIA in the last 168 hours. Coagulation Profile: No results for input(s): INR, PROTIME in the last 168 hours. Cardiac Enzymes: No results for input(s): CKTOTAL,  CKMB, CKMBINDEX, TROPONINI in the last 168 hours. BNP (last 3 results) No results for input(s): PROBNP in the last 8760 hours. HbA1C: No results for input(s): HGBA1C in the last 72 hours. CBG: No results for input(s): GLUCAP in the last 168 hours. Lipid Profile: No results for input(s): CHOL, HDL, LDLCALC, TRIG, CHOLHDL, LDLDIRECT in the last 72 hours. Thyroid Function Tests: No results for input(s): TSH, T4TOTAL, FREET4, T3FREE, THYROIDAB in the last 72 hours. Anemia Panel: No results for input(s): VITAMINB12, FOLATE, FERRITIN, TIBC, IRON, RETICCTPCT in the last 72 hours. Urine analysis: No results found for: COLORURINE, APPEARANCEUR, LABSPEC, PHURINE, GLUCOSEU, HGBUR, BILIRUBINUR, KETONESUR, PROTEINUR, UROBILINOGEN, NITRITE, LEUKOCYTESUR Sepsis Labs: @LABRCNTIP (procalcitonin:4,lacticidven:4) )No results found for this or any previous visit (from the past 240 hour(s)).   Radiological Exams on Admission: MR Brain W and Wo Contrast  Result Date: 04/14/2019 CLINICAL DATA:  Right eye vision loss with optic nerve swelling. Concern for optic neuritis. EXAM: MRI HEAD AND ORBITS WITHOUT AND WITH CONTRAST TECHNIQUE: Multiplanar, multiecho pulse sequences of the brain and surrounding structures were obtained without and with intravenous contrast. Multiplanar, multiecho pulse sequences of the orbits and surrounding structures were obtained including fat saturation techniques, before and after intravenous contrast administration. CONTRAST:  24mL GADAVIST GADOBUTROL 1 MMOL/ML IV SOLN COMPARISON:  None. FINDINGS: MRI HEAD FINDINGS Brain: There is no evidence of acute infarct, intracranial hemorrhage, mass, midline shift, or extra-axial fluid collection. The ventricles and sulci are normal. The brain is normal in signal. No abnormal enhancement is identified. Vascular: Major intracranial vascular flow voids are preserved. Normal enhancement of the dural venous sinuses. Skull and upper cervical spine:  Unremarkable bone marrow signal. Other: None. MRI ORBITS FINDINGS Orbits: Image quality is degraded by motion artifact, however there is abnormal enhancement of much of the intraorbital portion of the right optic nerve as well as abnormal enhancement surrounding the nerve, and there is also abnormal enhancement of the anterior aspect of the left intraorbital optic nerve. The globes are symmetric and appear intact. No orbital mass is identified. The extraocular muscles and lacrimal glands are unremarkable. Visualized sinuses: Paranasal sinuses and mastoid air cells are clear. Soft tissues: None. Limited intracranial: Unremarkable appearance of the optic chiasm, cavernous sinuses, and pituitary gland. IMPRESSION: 1. Abnormal enhancement of the right greater than left optic nerves consistent with optic neuritis. 2. Unremarkable appearance of the brain. Electronically Signed   By: Logan Bores M.D.   On: 04/14/2019 20:42   MR ORBITS W WO CONTRAST  Result Date: 04/14/2019 CLINICAL DATA:  Right eye vision loss with optic nerve swelling. Concern for optic neuritis. EXAM: MRI HEAD AND ORBITS WITHOUT AND WITH CONTRAST TECHNIQUE: Multiplanar, multiecho pulse sequences of the brain and surrounding structures were obtained without and with intravenous contrast. Multiplanar, multiecho pulse sequences of the orbits and surrounding structures were obtained including fat saturation  techniques, before and after intravenous contrast administration. CONTRAST:  34mL GADAVIST GADOBUTROL 1 MMOL/ML IV SOLN COMPARISON:  None. FINDINGS: MRI HEAD FINDINGS Brain: There is no evidence of acute infarct, intracranial hemorrhage, mass, midline shift, or extra-axial fluid collection. The ventricles and sulci are normal. The brain is normal in signal. No abnormal enhancement is identified. Vascular: Major intracranial vascular flow voids are preserved. Normal enhancement of the dural venous sinuses. Skull and upper cervical spine: Unremarkable  bone marrow signal. Other: None. MRI ORBITS FINDINGS Orbits: Image quality is degraded by motion artifact, however there is abnormal enhancement of much of the intraorbital portion of the right optic nerve as well as abnormal enhancement surrounding the nerve, and there is also abnormal enhancement of the anterior aspect of the left intraorbital optic nerve. The globes are symmetric and appear intact. No orbital mass is identified. The extraocular muscles and lacrimal glands are unremarkable. Visualized sinuses: Paranasal sinuses and mastoid air cells are clear. Soft tissues: None. Limited intracranial: Unremarkable appearance of the optic chiasm, cavernous sinuses, and pituitary gland. IMPRESSION: 1. Abnormal enhancement of the right greater than left optic nerves consistent with optic neuritis. 2. Unremarkable appearance of the brain. Electronically Signed   By: Sebastian Ache M.D.   On: 04/14/2019 20:42     Assessment/Plan Principal Problem:   Optic neuritis    1. Optic neuritis affecting more the right side -appreciate neurology consult patient is on IV methylprednisolone 1 g daily for next 5 days.  Protonix.  Further recommendations per neurology.  MRI of the C-spine is pending. 2. Mild hypokalemia replace recheck. 3. Tobacco abuse -tobacco cessation counseling requested.   DVT prophylaxis: SCDs secondary severe possible procedure. Code Status: Full code. Family Communication: Discussed with patient. Disposition Plan: Home. Consults called: Neurology. Admission status: Observation.   Eduard Clos MD [facility] Pager [phone].  If 7PM-7AM, please contact night-coverage [email or web address] Password [facility]  04/14/2019, 10:40 PM

## 2019-04-14 NOTE — ED Provider Notes (Signed)
[name] EMERGENCY DEPARTMENT Provider Note   CSN: [id number] Arrival date & time: 04/14/19  1729     History Chief Complaint  Patient presents with  . Loss of Vision    Patrick Duffy is a 54 y.o. male.  HPI   Patient presents ED for evaluation of decreased vision in his right eye.  Patient states he noticed the symptoms on Monday.  It has only been his right eye.  It seems to wax and wane somewhat, but he is unable to distinguish my face with his right eye.  Patient states he went to an eye doctor.  He saw a Dr. Elson Duffy.  On exam he was noted to have right optic nerve swelling.  Concern was for possible optic neuritis.  He was instructed to come to the ED for MRI.  Patient denies any issues with fevers or chills.  No numbness or weakness.  No other complaints.  Past Medical History:  Diagnosis Date  . HTN (hypertension)     There are no problems to display for this patient.   History reviewed. No pertinent surgical history.     No family history on file.  Social History   Tobacco Use  . Smoking status: Current Some Day Smoker    Types: Cigarettes  . Smokeless tobacco: Never Used  Substance Use Topics  . Alcohol use: No  . Drug use: No    Home Medications Prior to Admission medications   Medication Sig Start Date End Date Taking? Authorizing Provider  sertraline (ZOLOFT) 50 MG tablet Take 1 tablet (50 mg total) by mouth daily. 03/12/19   Arfeen, Phillips Grout, MD    Allergies    Patient has no known allergies.  Review of Systems   Review of Systems  All other systems reviewed and are negative.   Physical Exam Updated Vital Signs Ht 1.753 m (5\' 9" )   Wt 88.5 kg   BMI 28.80 kg/m   Physical Exam Vitals and nursing note reviewed.  Constitutional:      General: He is not in acute distress.    Appearance: He is well-developed.  HENT:     Head: Normocephalic and atraumatic.     Right Ear: External ear normal.     Left Ear: External ear  normal.  Eyes:     General: No scleral icterus.       Right eye: No discharge.        Left eye: No discharge.     Conjunctiva/sclera: Conjunctivae normal.     Pupils:     Right eye: Pupil is not reactive.     Comments: Right sided afferent pupillary defect, left side pupil normal  Neck:     Trachea: No tracheal deviation.  Cardiovascular:     Rate and Rhythm: Normal rate and regular rhythm.  Pulmonary:     Effort: Pulmonary effort is normal. No respiratory distress.     Breath sounds: Normal breath sounds. No stridor. No wheezing or rales.  Abdominal:     General: Bowel sounds are normal. There is no distension.     Palpations: Abdomen is soft.     Tenderness: There is no abdominal tenderness. There is no guarding or rebound.  Musculoskeletal:        General: No tenderness.     Cervical back: Neck supple.  Skin:    General: Skin is warm and dry.     Findings: No rash.  Neurological:     Mental  Status: He is alert.     Cranial Nerves: No cranial nerve deficit (no facial droop, extraocular movements intact, no slurred speech).     Sensory: No sensory deficit.     Motor: No abnormal muscle tone or seizure activity.     Coordination: Coordination normal.     Comments: Normal strength and sensation bilateral upper extremities, no facial droop     ED Results / Procedures / Treatments   Labs (all labs ordered are listed, but only abnormal results are displayed) Labs Reviewed  CBC - Abnormal; Notable for the following components:      Result Value   RDW 16.0 (*)    All other components within normal limits  BASIC METABOLIC PANEL - Abnormal; Notable for the following components:   Potassium 3.4 (*)    Creatinine, Ser 1.28 (*)    All other components within normal limits  NEUROMYELITIS OPTICA AUTOAB, IGG    EKG None  Radiology MR Brain W and Wo Contrast  Result Date: 04/14/2019 CLINICAL DATA:  Right eye vision loss with optic nerve swelling. Concern for optic neuritis.  EXAM: MRI HEAD AND ORBITS WITHOUT AND WITH CONTRAST TECHNIQUE: Multiplanar, multiecho pulse sequences of the brain and surrounding structures were obtained without and with intravenous contrast. Multiplanar, multiecho pulse sequences of the orbits and surrounding structures were obtained including fat saturation techniques, before and after intravenous contrast administration. CONTRAST:  76mL GADAVIST GADOBUTROL 1 MMOL/ML IV SOLN COMPARISON:  None. FINDINGS: MRI HEAD FINDINGS Brain: There is no evidence of acute infarct, intracranial hemorrhage, mass, midline shift, or extra-axial fluid collection. The ventricles and sulci are normal. The brain is normal in signal. No abnormal enhancement is identified. Vascular: Major intracranial vascular flow voids are preserved. Normal enhancement of the dural venous sinuses. Skull and upper cervical spine: Unremarkable bone marrow signal. Other: None. MRI ORBITS FINDINGS Orbits: Image quality is degraded by motion artifact, however there is abnormal enhancement of much of the intraorbital portion of the right optic nerve as well as abnormal enhancement surrounding the nerve, and there is also abnormal enhancement of the anterior aspect of the left intraorbital optic nerve. The globes are symmetric and appear intact. No orbital mass is identified. The extraocular muscles and lacrimal glands are unremarkable. Visualized sinuses: Paranasal sinuses and mastoid air cells are clear. Soft tissues: None. Limited intracranial: Unremarkable appearance of the optic chiasm, cavernous sinuses, and pituitary gland. IMPRESSION: 1. Abnormal enhancement of the right greater than left optic nerves consistent with optic neuritis. 2. Unremarkable appearance of the brain. Electronically Signed   By: Sebastian Ache M.D.   On: 04/14/2019 20:42   MR ORBITS W WO CONTRAST  Result Date: 04/14/2019 CLINICAL DATA:  Right eye vision loss with optic nerve swelling. Concern for optic neuritis. EXAM: MRI HEAD  AND ORBITS WITHOUT AND WITH CONTRAST TECHNIQUE: Multiplanar, multiecho pulse sequences of the brain and surrounding structures were obtained without and with intravenous contrast. Multiplanar, multiecho pulse sequences of the orbits and surrounding structures were obtained including fat saturation techniques, before and after intravenous contrast administration. CONTRAST:  42mL GADAVIST GADOBUTROL 1 MMOL/ML IV SOLN COMPARISON:  None. FINDINGS: MRI HEAD FINDINGS Brain: There is no evidence of acute infarct, intracranial hemorrhage, mass, midline shift, or extra-axial fluid collection. The ventricles and sulci are normal. The brain is normal in signal. No abnormal enhancement is identified. Vascular: Major intracranial vascular flow voids are preserved. Normal enhancement of the dural venous sinuses. Skull and upper cervical spine: Unremarkable bone marrow signal. Other:  None. MRI ORBITS FINDINGS Orbits: Image quality is degraded by motion artifact, however there is abnormal enhancement of much of the intraorbital portion of the right optic nerve as well as abnormal enhancement surrounding the nerve, and there is also abnormal enhancement of the anterior aspect of the left intraorbital optic nerve. The globes are symmetric and appear intact. No orbital mass is identified. The extraocular muscles and lacrimal glands are unremarkable. Visualized sinuses: Paranasal sinuses and mastoid air cells are clear. Soft tissues: None. Limited intracranial: Unremarkable appearance of the optic chiasm, cavernous sinuses, and pituitary gland. IMPRESSION: 1. Abnormal enhancement of the right greater than left optic nerves consistent with optic neuritis. 2. Unremarkable appearance of the brain. Electronically Signed   By: Logan Bores M.D.   On: 04/14/2019 20:42    Procedures Procedures (including critical care time)  Medications Ordered in ED Medications  methylPREDNISolone sodium succinate (SOLU-MEDROL) 1,000 mg in sodium  chloride 0.9 % 50 mL IVPB (has no administration in time range)  LORazepam (ATIVAN) injection 1 mg (has no administration in time range)  LORazepam (ATIVAN) injection 1 mg (1 mg Intravenous Given 04/14/19 1905)  gadobutrol (GADAVIST) 1 MMOL/ML injection 8 mL (8 mLs Intravenous Contrast Given 04/14/19 2021)    ED Course  I have reviewed the triage vital signs and the nursing notes.  Pertinent labs & imaging results that were available during my care of the patient were reviewed by me and considered in my medical decision making (see chart for details).  Clinical Course as of Apr 13 2128  Wed Apr 14, 2019  2052 MRI finding is consistent with optic neuritis   [JK]  2052 Findings were discussed with Dr. Cheral Marker.  Plan on IV high-dose steroids.  Will consult with medical service for admission.   [JK]    Clinical Course User Index [JK] Dorie Rank, MD   MDM Rules/Calculators/A&P                      Patient presents with acute optic neuritis.  Findings confirmed on MRI.  No other abnormalities noted.  Plan on admission for high-dose IV steroids. Final Clinical Impression(s) / ED Diagnoses Final diagnoses:  Optic neuritis       Dorie Rank, MD 04/14/19 2131

## 2019-04-15 ENCOUNTER — Observation Stay (HOSPITAL_COMMUNITY): Payer: 59

## 2019-04-15 DIAGNOSIS — H469 Unspecified optic neuritis: Secondary | ICD-10-CM | POA: Diagnosis present

## 2019-04-15 DIAGNOSIS — E876 Hypokalemia: Secondary | ICD-10-CM | POA: Diagnosis present

## 2019-04-15 DIAGNOSIS — F17210 Nicotine dependence, cigarettes, uncomplicated: Secondary | ICD-10-CM | POA: Diagnosis present

## 2019-04-15 DIAGNOSIS — Z20822 Contact with and (suspected) exposure to covid-19: Secondary | ICD-10-CM | POA: Diagnosis present

## 2019-04-15 DIAGNOSIS — Z720 Tobacco use: Secondary | ICD-10-CM

## 2019-04-15 LAB — CBC
HCT: 43.4 % (ref 39.0–52.0)
Hemoglobin: 14.7 g/dL (ref 13.0–17.0)
MCH: 27.7 pg (ref 26.0–34.0)
MCHC: 33.9 g/dL (ref 30.0–36.0)
MCV: 81.7 fL (ref 80.0–100.0)
Platelets: 299 10*3/uL (ref 150–400)
RBC: 5.31 MIL/uL (ref 4.22–5.81)
RDW: 16 % — ABNORMAL HIGH (ref 11.5–15.5)
WBC: 6.8 10*3/uL (ref 4.0–10.5)
nRBC: 0 % (ref 0.0–0.2)

## 2019-04-15 LAB — BASIC METABOLIC PANEL
Anion gap: 12 (ref 5–15)
BUN: 14 mg/dL (ref 6–20)
CO2: 23 mmol/L (ref 22–32)
Calcium: 8.8 mg/dL — ABNORMAL LOW (ref 8.9–10.3)
Chloride: 104 mmol/L (ref 98–111)
Creatinine, Ser: 1.08 mg/dL (ref 0.61–1.24)
GFR calc Af Amer: >60 mL/min (ref >60)
GFR calc non Af Amer: >60 mL/min (ref >60)
Glucose, Bld: 106 mg/dL — ABNORMAL HIGH (ref 70–99)
Potassium: 3.4 mmol/L — ABNORMAL LOW (ref 3.5–5.1)
Sodium: 139 mmol/L (ref 135–145)

## 2019-04-15 LAB — SARS CORONAVIRUS 2 (TAT 6-24 HRS): SARS Coronavirus 2: NEGATIVE

## 2019-04-15 LAB — HIV ANTIBODY (ROUTINE TESTING W REFLEX): HIV Screen 4th Generation wRfx: NONREACTIVE

## 2019-04-15 MED ORDER — SODIUM CHLORIDE 0.9 % IV SOLN
1000.0000 mg | Freq: Every day | INTRAVENOUS | Status: DC
  Administered 2019-04-15: 15:00:00 1000 mg via INTRAVENOUS
  Filled 2019-04-15: qty 8

## 2019-04-15 MED ORDER — POTASSIUM CHLORIDE CRYS ER 20 MEQ PO TBCR
40.0000 meq | EXTENDED_RELEASE_TABLET | Freq: Once | ORAL | Status: AC
  Administered 2019-04-15: 40 meq via ORAL
  Filled 2019-04-15: qty 2

## 2019-04-15 MED ORDER — GADOBUTROL 1 MMOL/ML IV SOLN
9.0000 mL | Freq: Once | INTRAVENOUS | Status: AC | PRN
  Administered 2019-04-15: 07:00:00 9 mL via INTRAVENOUS

## 2019-04-15 MED ORDER — LORAZEPAM 2 MG/ML IJ SOLN
1.0000 mg | Freq: Once | INTRAMUSCULAR | Status: AC
  Administered 2019-04-15: 1 mg via INTRAVENOUS
  Filled 2019-04-15: qty 1

## 2019-04-15 MED ORDER — NICOTINE POLACRILEX 2 MG MT GUM
2.0000 mg | CHEWING_GUM | OROMUCOSAL | Status: DC | PRN
  Filled 2019-04-15: qty 1

## 2019-04-15 MED ORDER — POTASSIUM CHLORIDE CRYS ER 20 MEQ PO TBCR
20.0000 meq | EXTENDED_RELEASE_TABLET | Freq: Once | ORAL | Status: AC
  Administered 2019-04-15: 08:00:00 20 meq via ORAL
  Filled 2019-04-15: qty 1

## 2019-04-15 MED ORDER — LORATADINE 10 MG PO TABS
10.0000 mg | ORAL_TABLET | Freq: Every day | ORAL | Status: DC
  Administered 2019-04-15: 15:00:00 10 mg via ORAL
  Filled 2019-04-15: qty 1

## 2019-04-15 MED ORDER — HYDROXYZINE HCL 25 MG PO TABS: 25.0000 mg | ORAL_TABLET | Freq: Three times a day (TID) | ORAL | Status: DC | PRN

## 2019-04-15 MED ORDER — WHITE PETROLATUM EX OINT
TOPICAL_OINTMENT | CUTANEOUS | Status: AC
  Filled 2019-04-15: qty 28.35

## 2019-04-15 NOTE — Progress Notes (Signed)
Neurology Progress Note   S:// Seen and examined.  No acute changes.   O:// Current vital signs: BP (!) 123/92 (BP Location: Right Arm)   Pulse 94   Temp 98.4 F (36.9 C) (Oral)   Resp 18   Ht 5\' 9"  (1.753 m)   Wt 89.1 kg   SpO2 100%   BMI 29.01 kg/m  Vital signs in last 24 hours: Temp:  [98.4 F (36.9 C)-98.5 F (36.9 C)] 98.4 F (36.9 C) (02/25 0819) Pulse Rate:  [64-94] 94 (02/25 0819) Resp:  [16-19] 18 (02/25 0819) BP: (123-149)/(88-110) 123/92 (02/25 0819) SpO2:  [97 %-100 %] 100 % (02/25 0819) Weight:  [88.5 kg-89.1 kg] 89.1 kg (02/25 0028) Neurological exam Awake alert oriented x3 Aphasia No dysarthria Cranial nerves: Right eye vision-can only perceive shapes and motion.  Left eye normal acuity.  Noted RAPD on the right.  Face symmetric.  Eye movements normal. Motor exam: 5/5 all over Sensory: Sensate to touch all over Coordination: No ataxia DTRs normal and symmetric throughout Gait testing deferred at this time  Medications  Current Facility-Administered Medications:  .  acetaminophen (TYLENOL) tablet 650 mg, 650 mg, Oral, Q6H PRN, 650 mg at 04/15/19 0218 **OR** acetaminophen (TYLENOL) suppository 650 mg, 650 mg, Rectal, Q6H PRN, 0219, MD .  methylPREDNISolone sodium succinate (SOLU-MEDROL) 1,000 mg in sodium chloride 0.9 % 50 mL IVPB, 1,000 mg, Intravenous, Daily, Eduard Clos, MD, Last Rate: 58 mL/hr at 04/15/19 0051, 1,000 mg at 04/15/19 0051 .  ondansetron (ZOFRAN) tablet 4 mg, 4 mg, Oral, Q6H PRN **OR** ondansetron (ZOFRAN) injection 4 mg, 4 mg, Intravenous, Q6H PRN, 04/17/19, MD .  pantoprazole (PROTONIX) EC tablet 40 mg, 40 mg, Oral, Q1200, Eduard Clos, MD, 40 mg at 04/15/19 0055 Labs CBC    Component Value Date/Time   WBC 6.8 04/15/2019 0050   RBC 5.31 04/15/2019 0050   HGB 14.7 04/15/2019 0050   HCT 43.4 04/15/2019 0050   PLT 299 04/15/2019 0050   MCV 81.7 04/15/2019 0050   MCH 27.7 04/15/2019 0050    MCHC 33.9 04/15/2019 0050   RDW 16.0 (H) 04/15/2019 0050   LYMPHSABS 2.8 09/22/2009 0625   MONOABS 0.6 09/22/2009 0625   EOSABS 0.5 09/22/2009 0625   BASOSABS 0.1 09/22/2009 0625    CMP     Component Value Date/Time   NA 139 04/15/2019 0050   K 3.4 (L) 04/15/2019 0050   CL 104 04/15/2019 0050   CO2 23 04/15/2019 0050   GLUCOSE 106 (H) 04/15/2019 0050   BUN 14 04/15/2019 0050   CREATININE 1.08 04/15/2019 0050   CALCIUM 8.8 (L) 04/15/2019 0050   PROT 7.3 09/22/2009 0625   ALBUMIN 4.5 09/22/2009 0625   AST 35 09/22/2009 0625   ALT 49 09/22/2009 0625   ALKPHOS 63 09/22/2009 0625   BILITOT 0.9 09/22/2009 0625   GFRNONAA >60 04/15/2019 0050   GFRAA >60 04/15/2019 0050    glycosylated hemoglobin  Lipid Panel     Component Value Date/Time   CHOL 133 03/03/2007 1442   TRIG 180 (H) 03/03/2007 1442   HDL 28.0 (L) 03/03/2007 1442   CHOLHDL 4.8 CALC 03/03/2007 1442   VLDL 36 03/03/2007 1442   LDLCALC 69 03/03/2007 1442     Imaging I have reviewed images in epic and the results pertinent to this consultation are: MR brain and orbits with evidence of right optic neuritis with enhancement.  Otherwise brain appearance unremarkable and no enhancement. MR C-spine without  any evidence of demyelinating lesions-formal read pending  Impression: Right optic neuritis  Recommendations: MR T-spine with and without contrast NMO antibodies Patient would like to get out of the hospital as soon as possible-we will try to see if he can schedule couple of rounds of steroid infusion outpatient at the any [facility] infusion center. Will need outpatient follow-up with Dr. Rachel Moulds for disease modifying therapy Discussed with Dr. Eliseo Squires   -- Amie Portland, MD [facility] Pager: [phone] If 7pm to 7am, please call on call as listed on AMION.

## 2019-04-15 NOTE — Progress Notes (Signed)
Progress Note    HAFIZ IRION  KGU:[id number] DOB: 12-28-1965  DOA: 04/14/2019 PCP: Patient, No Pcp Per    Brief Narrative:     Medical records reviewed and are as summarized below:  Deejay B Maus is an 54 y.o. male with no significant past medical history presents to the ER with complaint of blurred vision of the right eye.  Patient has been having blurred vision for the last 9 days had gone to ophthalmologist who referred to the ER concerning for optic neuritis.  Plan is for 5 days of IV steroids-- will get 3 doses in hospital and then 2 dose Monday/Tuesday at the [place] infusion clinic.   Assessment/Plan:   Principal Problem:   Optic neuritis Active Problems:   Hypokalemia   Tobacco abuse   Optic neuritis  -appreciate neurology consult patient  -IV methylprednisolone 1 g daily for  5 days.   -MRI of C/T spine once able  Mild hypokalemia  -replace recheck.  Tobacco abuse -tobacco cessation counseling requested.   Family Communication/Anticipated D/C date and plan/Code Status   DVT prophylaxis: SCD/ambulatory Code Status: Full Code.  Family Communication:  Disposition Plan: home tomm PM after IV Steroids and MRI of T spine   Medical Consultants:    Neurology     Subjective:   Would like to get up and walk around  Objective:    Vitals:   04/14/19 2303 04/15/19 0028 04/15/19 0342 04/15/19 0819  BP: (!) 149/110 123/90 129/88 (!) 123/92  Pulse: 68 64 69 94  Resp: 16 18 18 18   Temp:  98.4 F (36.9 C) 98.5 F (36.9 C) 98.4 F (36.9 C)  TempSrc:  Oral Oral Oral  SpO2: 98% 99% 99% 100%  Weight:  89.1 kg    Height:  5\' 9"  (1.753 m)      Intake/Output Summary (Last 24 hours) at 04/15/2019 1103 Last data filed at 04/15/2019 0900 Gross per 24 hour  Intake 830 ml  Output --  Net 830 ml   Filed Weights   04/14/19 1744 04/15/19 0028  Weight: 88.5 kg 89.1 kg    Exam: In bed, NAD rrr No increased work of breathing No LE edema A+Ox3 Moves all 4  ext   Data Reviewed:   I have personally reviewed following labs and imaging studies:  Labs: Labs show the following:   Basic Metabolic Panel: Recent Labs  Lab 04/14/19 1810 04/15/19 0050  NA 141 139  K 3.4* 3.4*  CL 107 104  CO2 23 23  GLUCOSE 97 106*  BUN 17 14  CREATININE 1.28* 1.08  CALCIUM 9.1 8.8*   GFR Estimated Creatinine Clearance: 88.4 mL/min (by C-G formula based on SCr of 1.08 mg/dL). Liver Function Tests: No results for input(s): AST, ALT, ALKPHOS, BILITOT, PROT, ALBUMIN in the last 168 hours. No results for input(s): LIPASE, AMYLASE in the last 168 hours. No results for input(s): AMMONIA in the last 168 hours. Coagulation profile No results for input(s): INR, PROTIME in the last 168 hours.  CBC: Recent Labs  Lab 04/14/19 1810 04/15/19 0050  WBC 7.8 6.8  HGB 15.0 14.7  HCT 44.2 43.4  MCV 82.6 81.7  PLT 310 299   Cardiac Enzymes: No results for input(s): CKTOTAL, CKMB, CKMBINDEX, TROPONINI in the last 168 hours. BNP (last 3 results) No results for input(s): PROBNP in the last 8760 hours. CBG: No results for input(s): GLUCAP in the last 168 hours. D-Dimer: No results for input(s): DDIMER in the last  72 hours. Hgb A1c: No results for input(s): HGBA1C in the last 72 hours. Lipid Profile: No results for input(s): CHOL, HDL, LDLCALC, TRIG, CHOLHDL, LDLDIRECT in the last 72 hours. Thyroid function studies: No results for input(s): TSH, T4TOTAL, T3FREE, THYROIDAB in the last 72 hours.  Invalid input(s): FREET3 Anemia work up: No results for input(s): VITAMINB12, FOLATE, FERRITIN, TIBC, IRON, RETICCTPCT in the last 72 hours. Sepsis Labs: Recent Labs  Lab 04/14/19 1810 04/15/19 0050  WBC 7.8 6.8    Microbiology Recent Results (from the past 240 hour(s))  SARS CORONAVIRUS 2 (TAT 6-24 HRS) Nasopharyngeal Nasopharyngeal Swab     Status: None   Collection Time: 04/14/19 10:51 PM   Specimen: Nasopharyngeal Swab  Result Value Ref Range Status    SARS Coronavirus 2 NEGATIVE NEGATIVE Final    Comment: (NOTE) SARS-CoV-2 target nucleic acids are NOT DETECTED. The SARS-CoV-2 RNA is generally detectable in upper and lower respiratory specimens during the acute phase of infection. Negative results do not preclude SARS-CoV-2 infection, do not rule out co-infections with other pathogens, and should not be used as the sole basis for treatment or other patient management decisions. Negative results must be combined with clinical observations, patient history, and epidemiological information. The expected result is Negative. Fact Sheet for Patients: HairSlick.no Fact Sheet for Healthcare Providers: quierodirigir.com This test is not yet approved or cleared by the [place] FDA and  has been authorized for detection and/or diagnosis of SARS-CoV-2 by FDA under an Emergency Use Authorization (EUA). This EUA will remain  in effect (meaning this test can be used) for the duration of the COVID-19 declaration under Section 56 4(b)(1) of the Act, 21 U.S.C. section 360bbb-3(b)(1), unless the authorization is terminated or revoked sooner. Performed at [facility], [address], [place], [place] [address]     Procedures and diagnostic studies:  MR Brain W and Wo Contrast  Result Date: 04/14/2019 CLINICAL DATA:  Right eye vision loss with optic nerve swelling. Concern for optic neuritis. EXAM: MRI HEAD AND ORBITS WITHOUT AND WITH CONTRAST TECHNIQUE: Multiplanar, multiecho pulse sequences of the brain and surrounding structures were obtained without and with intravenous contrast. Multiplanar, multiecho pulse sequences of the orbits and surrounding structures were obtained including fat saturation techniques, before and after intravenous contrast administration. CONTRAST:  18mL GADAVIST GADOBUTROL 1 MMOL/ML IV SOLN COMPARISON:  None. FINDINGS: MRI HEAD FINDINGS Brain: There is no evidence  of acute infarct, intracranial hemorrhage, mass, midline shift, or extra-axial fluid collection. The ventricles and sulci are normal. The brain is normal in signal. No abnormal enhancement is identified. Vascular: Major intracranial vascular flow voids are preserved. Normal enhancement of the dural venous sinuses. Skull and upper cervical spine: Unremarkable bone marrow signal. Other: None. MRI ORBITS FINDINGS Orbits: Image quality is degraded by motion artifact, however there is abnormal enhancement of much of the intraorbital portion of the right optic nerve as well as abnormal enhancement surrounding the nerve, and there is also abnormal enhancement of the anterior aspect of the left intraorbital optic nerve. The globes are symmetric and appear intact. No orbital mass is identified. The extraocular muscles and lacrimal glands are unremarkable. Visualized sinuses: Paranasal sinuses and mastoid air cells are clear. Soft tissues: None. Limited intracranial: Unremarkable appearance of the optic chiasm, cavernous sinuses, and pituitary gland. IMPRESSION: 1. Abnormal enhancement of the right greater than left optic nerves consistent with optic neuritis. 2. Unremarkable appearance of the brain. Electronically Signed   By: Jolaine Click.D.  On: 04/14/2019 20:42   MR ORBITS W WO CONTRAST  Result Date: 04/14/2019 CLINICAL DATA:  Right eye vision loss with optic nerve swelling. Concern for optic neuritis. EXAM: MRI HEAD AND ORBITS WITHOUT AND WITH CONTRAST TECHNIQUE: Multiplanar, multiecho pulse sequences of the brain and surrounding structures were obtained without and with intravenous contrast. Multiplanar, multiecho pulse sequences of the orbits and surrounding structures were obtained including fat saturation techniques, before and after intravenous contrast administration. CONTRAST:  50mL GADAVIST GADOBUTROL 1 MMOL/ML IV SOLN COMPARISON:  None. FINDINGS: MRI HEAD FINDINGS Brain: There is no evidence of acute  infarct, intracranial hemorrhage, mass, midline shift, or extra-axial fluid collection. The ventricles and sulci are normal. The brain is normal in signal. No abnormal enhancement is identified. Vascular: Major intracranial vascular flow voids are preserved. Normal enhancement of the dural venous sinuses. Skull and upper cervical spine: Unremarkable bone marrow signal. Other: None. MRI ORBITS FINDINGS Orbits: Image quality is degraded by motion artifact, however there is abnormal enhancement of much of the intraorbital portion of the right optic nerve as well as abnormal enhancement surrounding the nerve, and there is also abnormal enhancement of the anterior aspect of the left intraorbital optic nerve. The globes are symmetric and appear intact. No orbital mass is identified. The extraocular muscles and lacrimal glands are unremarkable. Visualized sinuses: Paranasal sinuses and mastoid air cells are clear. Soft tissues: None. Limited intracranial: Unremarkable appearance of the optic chiasm, cavernous sinuses, and pituitary gland. IMPRESSION: 1. Abnormal enhancement of the right greater than left optic nerves consistent with optic neuritis. 2. Unremarkable appearance of the brain. Electronically Signed   By: Sebastian Ache M.D.   On: 04/14/2019 20:42    Medications:   . pantoprazole  40 mg Oral Q1200   Continuous Infusions: . methylPREDNISolone (SOLU-MEDROL) injection       LOS: 0 days   Joseph Art  [facility]   How to contact the [facility] Attending or Consulting provider 7A - 7P or covering provider during after hours 7P -7A, for this patient?  1. Check the care team in [facility] and look for a) attending/consulting [facility] provider listed and b) the [facility] team listed 2. Log into [email or web address] and use [facility]'s universal password to access. If you do not have the password, please contact the hospital operator. 3. Locate the [facility] provider you are looking for under [facility] and page to a  number that you can be directly reached. 4. If you still have difficulty reaching the provider, please page the [facility] (Director on Call) for the Hospitalists listed on amion for assistance.  04/15/2019, 11:03 AM

## 2019-04-15 NOTE — Progress Notes (Signed)
Patient unwilling to stay in hospital for IV steroids tomorrow. Dr. Benjamine Mola notified. Dr. Wilford Corner came to bedside. Patient discharged home. Pt not being patient with discharge process. Discharge paperwork went over with patient but patient stating he was ready to leave without a ride. Nurse asked patient who was going to pick him up. Pt stated he was going to drive. This nurse informed patient that he could not drive. Patient called Benedetto Goad to get a ride to his office. Patient still stating he does not understand why he cant walk downstairs by himself. Security called to walk patient downstairs. Tyron Russell Diesel Lina

## 2019-04-15 NOTE — Progress Notes (Signed)
RN requested repeat order for Ativan for repeat MRI.  Pt was seen in ED by Dr Otelia Limes, who ordered 1mg  of ativan x1 for the patient's first MRI.  No TRH note available, [facility] note empty.  One time ativan order.

## 2019-04-15 NOTE — Plan of Care (Signed)
Spoke with [facility] infusion clinic - they can schedule steroid infusion for Monday and Tuesday as outpatient. They will call him to schedule.  -- Milon Dikes, MD [facility] Pager: [phone] If 7pm to 7am, please call on call as listed on AMION.

## 2019-04-15 NOTE — Plan of Care (Addendum)
Patient not willing to stay in the hospital due to personal and business related issues. I discussed with him in detail for over 30 to 40 minutes that he needs to get continuous doses of steroids as much as possible and I am trying to get his Monday and Tuesday infusions at [facility] neurology.  He was insistent on leaving, understanding well enough that not receiving continuous doses might hamper his improvement. He has received 2 doses of IV methylprednisolone. I have spoken to [facility] neurolog[facility] infusion [facility]-to have scheduled him for steroid infusion in the coming week as follows: -Monday 10 AM -Tuesday 10 AM -Wednesday T-spine MRI is normal  [facility] the next 1 to 2 weeks for disease modifying therapy discussion.  Plan discussed with Dr. Marlin Canary  -- Milon Dikes, MD [facility] Pager: [phone] If 7pm to 7am, please call on call as listed on AMION.

## 2019-04-15 NOTE — Discharge Summary (Signed)
Physician Discharge Summary  Patrick Duffy YKD:[id number] DOB: 21-Jan-1966 DOA: 04/14/2019  PCP: Patient, No Pcp Per  Admit date: 04/14/2019 Discharge date: 04/15/2019  Admitted From: Home Discharge disposition: Home   Recommendations for Outpatient Follow-Up:   1. Outpatient IV steroids arranged at [facility] neurology for Monday Tuesday and Wednesday of next week 2. Furl made for Dr. Epimenio Foot   Discharge Diagnosis:   Principal Problem:   Optic neuritis Active Problems:   Hypokalemia   Tobacco abuse    Discharge Condition: Improved.  Diet recommendation:   Regular.  Wound care: None.  Code status: Full.   History of Present Illness:   Patrick Duffy is a 54 y.o. male with no significant past medical history presents to the ER with complaint of blurred vision of the right eye.  Patient has been having blurred vision for the last 9 days had gone to ophthalmologist who referred to the ER concerning for optic neuritis.  Patient denies any tingling numbness or weakness of upper or lower extremities or any incontinence of bowel or urine.  Denies any headache.  Had some mild pain around the right eye.    Hospital Course by Problem:   Optic neuritis  -appreciate neurology consult on patient  -IV methylprednisolone 1 g daily for  5 days--patient despite extensive counseling was not willing to stay in the hospital for further doses.  Neurology arranged outpatient IV steroids for Monday/Tuesday/Wednesday of next week -MRI of C/T spine negative -Advised against driving  Mild hypokalemia  -replaced Tobacco abuse -tobacco cessation counseling requested.    Medical Consultants:    Neurology  Discharge Exam:   Vitals:   04/15/19 0342 04/15/19 0819  BP: 129/88 (!) 123/92  Pulse: 69 94  Resp: 18 18  Temp: 98.5 F (36.9 C) 98.4 F (36.9 C)  SpO2: 99% 100%   Vitals:   04/14/19 2303 04/15/19 0028 04/15/19 0342 04/15/19 0819  BP: (!) 149/110 123/90 129/88 (!)  123/92  Pulse: 68 64 69 94  Resp: 16 18 18 18   Temp:  98.4 F (36.9 C) 98.5 F (36.9 C) 98.4 F (36.9 C)  TempSrc:  Oral Oral Oral  SpO2: 98% 99% 99% 100%  Weight:  89.1 kg    Height:  5\' 9"  (1.753 m)      General exam: Anxious to leave  The results of significant diagnostics from this hospitalization (including imaging, microbiology, ancillary and laboratory) are listed below for reference.     Procedures and Diagnostic Studies:   MR Brain W and Wo Contrast  Result Date: 04/14/2019 CLINICAL DATA:  Right eye vision loss with optic nerve swelling. Concern for optic neuritis. EXAM: MRI HEAD AND ORBITS WITHOUT AND WITH CONTRAST TECHNIQUE: Multiplanar, multiecho pulse sequences of the brain and surrounding structures were obtained without and with intravenous contrast. Multiplanar, multiecho pulse sequences of the orbits and surrounding structures were obtained including fat saturation techniques, before and after intravenous contrast administration. CONTRAST:  70mL GADAVIST GADOBUTROL 1 MMOL/ML IV SOLN COMPARISON:  None. FINDINGS: MRI HEAD FINDINGS Brain: There is no evidence of acute infarct, intracranial hemorrhage, mass, midline shift, or extra-axial fluid collection. The ventricles and sulci are normal. The brain is normal in signal. No abnormal enhancement is identified. Vascular: Major intracranial vascular flow voids are preserved. Normal enhancement of the dural venous sinuses. Skull and upper cervical spine: Unremarkable bone marrow signal. Other: None. MRI ORBITS FINDINGS Orbits: Image quality is degraded by motion artifact, however there is abnormal enhancement  of much of the intraorbital portion of the right optic nerve as well as abnormal enhancement surrounding the nerve, and there is also abnormal enhancement of the anterior aspect of the left intraorbital optic nerve. The globes are symmetric and appear intact. No orbital mass is identified. The extraocular muscles and lacrimal  glands are unremarkable. Visualized sinuses: Paranasal sinuses and mastoid air cells are clear. Soft tissues: None. Limited intracranial: Unremarkable appearance of the optic chiasm, cavernous sinuses, and pituitary gland. IMPRESSION: 1. Abnormal enhancement of the right greater than left optic nerves consistent with optic neuritis. 2. Unremarkable appearance of the brain. Electronically Signed   By: Sebastian Ache M.D.   On: 04/14/2019 20:42   MR THORACIC SPINE WO CONTRAST  Result Date: 04/15/2019 CLINICAL DATA:  Onset blurry vision in the right eye 04/14/2019. Question demyelinating disease. EXAM: MRI THORACIC SPINE WITHOUT CONTRAST TECHNIQUE: Multiplanar, multisequence MR imaging of the thoracic spine was performed. No intravenous contrast was administered. COMPARISON:  Cervical spine MRI today and brain MRI 04/14/2019. FINDINGS: Alignment:  Normal. Vertebrae: No fracture, evidence of discitis, or bone lesion. Small Schmorl's node in the inferior endplate of T8 incidentally noted. Cord:  Normal signal throughout. Paraspinal and other soft tissues: Negative. Disc levels: The central spinal canal and neural foramina are widely patent at all levels. The patient has a very shallow right paracentral protrusion at T8-9 without stenosis. IMPRESSION: Normal appearing thoracic cord.  Negative for demyelinating disease. Very shallow right paracentral protrusion at T8-9 without central canal or foraminal stenosis. Electronically Signed   By: Drusilla Kanner M.D.   On: 04/15/2019 12:36   MR CERVICAL SPINE W WO CONTRAST  Result Date: 04/15/2019 CLINICAL DATA:  Optic neuritis by MRI and history. Demyelinating disease. EXAM: MRI CERVICAL SPINE WITHOUT AND WITH CONTRAST TECHNIQUE: Multiplanar and multiecho pulse sequences of the cervical spine, to include the craniocervical junction and cervicothoracic junction, were obtained without and with intravenous contrast. CONTRAST:  64mL GADAVIST GADOBUTROL 1 MMOL/ML IV SOLN  COMPARISON:  None. FINDINGS: Alignment: Normal Vertebrae: No fracture, evidence of discitis, or bone lesion. Cord: Normal signal and morphology. Posterior Fossa, vertebral arteries, paraspinal tissues: 3 enlarged submental lymph nodes measuring up to 1 cm in length. Disc levels: No significant degenerative changes and no degenerative impingement IMPRESSION: 1. Normal MRI of the cervical cord. 2. Enlarged submental lymph nodes, please correlate for oral inflammation or mass. Electronically Signed   By: Marnee Spring M.D.   On: 04/15/2019 07:24   MR ORBITS W WO CONTRAST  Result Date: 04/14/2019 CLINICAL DATA:  Right eye vision loss with optic nerve swelling. Concern for optic neuritis. EXAM: MRI HEAD AND ORBITS WITHOUT AND WITH CONTRAST TECHNIQUE: Multiplanar, multiecho pulse sequences of the brain and surrounding structures were obtained without and with intravenous contrast. Multiplanar, multiecho pulse sequences of the orbits and surrounding structures were obtained including fat saturation techniques, before and after intravenous contrast administration. CONTRAST:  70mL GADAVIST GADOBUTROL 1 MMOL/ML IV SOLN COMPARISON:  None. FINDINGS: MRI HEAD FINDINGS Brain: There is no evidence of acute infarct, intracranial hemorrhage, mass, midline shift, or extra-axial fluid collection. The ventricles and sulci are normal. The brain is normal in signal. No abnormal enhancement is identified. Vascular: Major intracranial vascular flow voids are preserved. Normal enhancement of the dural venous sinuses. Skull and upper cervical spine: Unremarkable bone marrow signal. Other: None. MRI ORBITS FINDINGS Orbits: Image quality is degraded by motion artifact, however there is abnormal enhancement of much of the intraorbital portion of the right optic  nerve as well as abnormal enhancement surrounding the nerve, and there is also abnormal enhancement of the anterior aspect of the left intraorbital optic nerve. The globes are  symmetric and appear intact. No orbital mass is identified. The extraocular muscles and lacrimal glands are unremarkable. Visualized sinuses: Paranasal sinuses and mastoid air cells are clear. Soft tissues: None. Limited intracranial: Unremarkable appearance of the optic chiasm, cavernous sinuses, and pituitary gland. IMPRESSION: 1. Abnormal enhancement of the right greater than left optic nerves consistent with optic neuritis. 2. Unremarkable appearance of the brain. Electronically Signed   By: Logan Bores M.D.   On: 04/14/2019 20:42     Labs:   Basic Metabolic Panel: Recent Labs  Lab 04/14/19 1810 04/15/19 0050  NA 141 139  K 3.4* 3.4*  CL 107 104  CO2 23 23  GLUCOSE 97 106*  BUN 17 14  CREATININE 1.28* 1.08  CALCIUM 9.1 8.8*   GFR Estimated Creatinine Clearance: 88.4 mL/min (by C-G formula based on SCr of 1.08 mg/dL). Liver Function Tests: No results for input(s): AST, ALT, ALKPHOS, BILITOT, PROT, ALBUMIN in the last 168 hours. No results for input(s): LIPASE, AMYLASE in the last 168 hours. No results for input(s): AMMONIA in the last 168 hours. Coagulation profile No results for input(s): INR, PROTIME in the last 168 hours.  CBC: Recent Labs  Lab 04/14/19 1810 04/15/19 0050  WBC 7.8 6.8  HGB 15.0 14.7  HCT 44.2 43.4  MCV 82.6 81.7  PLT 310 299   Cardiac Enzymes: No results for input(s): CKTOTAL, CKMB, CKMBINDEX, TROPONINI in the last 168 hours. BNP: Invalid input(s): POCBNP CBG: No results for input(s): GLUCAP in the last 168 hours. D-Dimer No results for input(s): DDIMER in the last 72 hours. Hgb A1c No results for input(s): HGBA1C in the last 72 hours. Lipid Profile No results for input(s): CHOL, HDL, LDLCALC, TRIG, CHOLHDL, LDLDIRECT in the last 72 hours. Thyroid function studies No results for input(s): TSH, T4TOTAL, T3FREE, THYROIDAB in the last 72 hours.  Invalid input(s): FREET3 Anemia work up No results for input(s): VITAMINB12, FOLATE, FERRITIN,  TIBC, IRON, RETICCTPCT in the last 72 hours. Microbiology Recent Results (from the past 240 hour(s))  SARS CORONAVIRUS 2 (TAT 6-24 HRS) Nasopharyngeal Nasopharyngeal Swab     Status: None   Collection Time: 04/14/19 10:51 PM   Specimen: Nasopharyngeal Swab  Result Value Ref Range Status   SARS Coronavirus 2 NEGATIVE NEGATIVE Final    Comment: (NOTE) SARS-CoV-2 target nucleic acids are NOT DETECTED. The SARS-CoV-2 RNA is generally detectable in upper and lower respiratory specimens during the acute phase of infection. Negative results do not preclude SARS-CoV-2 infection, do not rule out co-infections with other pathogens, and should not be used as the sole basis for treatment or other patient management decisions. Negative results must be combined with clinical observations, patient history, and epidemiological information. The expected result is Negative. Fact Sheet for Patients: SugarRoll.be Fact Sheet for Healthcare Providers: https://www.woods-mathews.com/ This test is not yet approved or cleared by the [place] FDA and  has been authorized for detection and/or diagnosis of SARS-CoV-2 by FDA under an Emergency Use Authorization (EUA). This EUA will remain  in effect (meaning this test can be used) for the duration of the COVID-19 declaration under Section 56 4(b)(1) of the Act, 21 U.S.C. section 360bbb-3(b)(1), unless the authorization is terminated or revoked sooner. Performed at [facility], [place] [address] [place], [place] [address]      Discharge Instructions:  Discharge Instructions    Diet general   Complete by: As directed    Discharge instructions   Complete by: As directed    No driving PPI-- can use OTC medications such as pepcid while taking steroids You will get a steroid infusion on Monday and Tuesday at 10 am and Wednesday at 12 PM-- [facility]   Increase activity slowly   Complete by: As  directed      Allergies as of 04/15/2019      Reactions   Trazodone And Nefazodone Other (See Comments)   Caused the patient to be groggy the next morning   Other Dermatitis, Rash   DYES (clothing)      Medication List    STOP taking these medications   sertraline 50 MG tablet Commonly known as: ZOLOFT     TAKE these medications   acetaminophen 500 MG tablet Commonly known as: TYLENOL Take 500-1,000 mg by mouth every 8 (eight) hours as needed for mild pain.   aspirin-sod bicarb-citric acid 325 MG Tbef tablet Commonly known as: ALKA-SELTZER Take 325 mg by mouth every 6 (six) hours as needed (for cold symptoms).   hydrOXYzine 25 MG tablet Commonly known as: ATARAX/VISTARIL Take 25-50 mg by mouth at bedtime as needed for itching.   triamcinolone cream 0.1 % Commonly known as: KENALOG Apply 1 application topically 2 (two) times daily as needed (until rash resolves- apply sparingly and rub in well).      Follow-up Information    [facility] Follow up.   Why: for your infusions Contact information: [address]     [address] [place] [address] [phone]           Time coordinating discharge: 35 min  Signed:  Joseph Art DO  [facility] 04/15/2019, 5:25 PM

## 2019-04-16 LAB — NEUROMYELITIS OPTICA AUTOAB, IGG: NMO-IgG: <1.5 U/mL (ref 0.0–3.0)

## 2019-04-20 ENCOUNTER — Telehealth: Payer: Self-pay | Admitting: *Deleted

## 2019-04-20 NOTE — Telephone Encounter (Signed)
Pt discharged from hospital and set up with intrafusion for IV steroids x3 days outpt. He was here today for day 2. Inquiring if he needs additional days after last day of steroids tomorrow.  Per hospital notes, he was set up for IV steorids Monday-Wednesday. Referral made 04/15/19 to get him scheduled with Dr. Epimenio Foot. Irish Lack, RN to let him know someone will call him to get new pt appt set up with Dr. Epimenio Foot.

## 2019-04-26 ENCOUNTER — Ambulatory Visit (INDEPENDENT_AMBULATORY_CARE_PROVIDER_SITE_OTHER): Payer: 59 | Admitting: Neurology

## 2019-04-26 ENCOUNTER — Encounter: Payer: Self-pay | Admitting: Neurology

## 2019-04-26 ENCOUNTER — Other Ambulatory Visit: Payer: Self-pay

## 2019-04-26 VITALS — BP 135/88 | HR 63 | Temp 97.9°F | Ht 69.0 in | Wt 201.0 lb

## 2019-04-26 DIAGNOSIS — G4700 Insomnia, unspecified: Secondary | ICD-10-CM | POA: Diagnosis not present

## 2019-04-26 DIAGNOSIS — H469 Unspecified optic neuritis: Secondary | ICD-10-CM

## 2019-04-26 NOTE — Progress Notes (Addendum)
[facility]  PATIENT: Patrick Duffy DOB: 19-Aug-1965  REFERRING DOCTOR OR PCP: Marlin Canary SOURCE: Patient, notes from emergency room and hospital visit 04/14/2019  _________________________________   HISTORICAL  CHIEF COMPLAINT:  Chief Complaint  Patient presents with  . New Patient (Initial Visit)    RM 13, alone. Internal referral from hospital for optic neuritis. Was previously on zoloft for 2-3 months. Stopped 2-3 weeks ago. Hospital told him to stop this.     HISTORY OF PRESENT ILLNESS:  I had the pleasure of seeing your patient, Patrick Duffy, at the MS center at [facility] for neurologic consultation regarding his right optic neuritis.  Mr. Coberly is a 54 year old man who woke up one day 2 weeks ago with blurry vision out of his right eye and vision worsened as the day progressed.  He did not have any eye pain.  He did not note any other neurologic symptoms..   After a week he was referred to ophthalmology.  He was found to have right optic nerve swelling.  OCT showed optic disc edema on the right and he was sent to the ED.   He was admitted and had 5 days of IV Solumedrol (2 days in hospital, 3 days as outpatient).   While in the hospital, he had MRIs of the brain, orbits, cervical spine and thoracic spine.  I personally reviewed these images.  There is evidence of enhancement of the right optic nerve on the orbital MRI.  The brain appears normal.  Cervical and thoracic spine MRIs show a normal spinal cord.  Pertinent lab tests show a negative anti-NMO antibody and HIV.  He reports improvement but is not yet at baseline.  He is able to read better out of the right eye and color vision has also improved.  He denies gait issues, weakness or numbness.  Bladder function is fine.            He notes some fatigue and has had insomnia.    He travels a lot and has had issues with jet lag.   He denies any mood or cognitive issues.  He has had eczema and  has been placed on kenalog creams at times.     REVIEW OF SYSTEMS: Constitutional: No fevers, chills, sweats, or change in appetite Eyes: See above Ear, nose and throat: No hearing loss, ear pain, nasal congestion, sore throat Cardiovascular: No chest pain, palpitations Respiratory: No shortness of breath at rest or with exertion.   No wheezes GastrointestinaI: No nausea, vomiting, diarrhea, abdominal pain, fecal incontinence Genitourinary: No dysuria, urinary retention or frequency.  No nocturia. Musculoskeletal: No neck pain, back pain Integumentary: No rash, pruritus, skin lesions.  History of eczema Neurological: as above Psychiatric: He has had some anxiety.  He has insomnia. Endocrine: No palpitations, diaphoresis, change in appetite, change in weigh or increased thirst Hematologic/Lymphatic: No anemia, purpura, petechiae. Allergic/Immunologic: No itchy/runny eyes, nasal congestion, recent allergic reactions, rashes  ALLERGIES: Allergies  Allergen Reactions  . Trazodone And Nefazodone Other (See Comments)    Caused the patient to be groggy the next morning   . Other Dermatitis and Rash    DYES (clothing)    HOME MEDICATIONS:  Current Outpatient Medications:  .  acetaminophen (TYLENOL) 500 MG tablet, Take 500-1,000 mg by mouth every 8 (eight) hours as needed for mild pain., Disp: , Rfl:  .  aspirin-sod bicarb-citric acid (ALKA-SELTZER) 325 MG TBEF tablet, Take 325 mg by mouth every 6 (six) hours  as needed (for cold symptoms)., Disp: , Rfl:  .  triamcinolone cream (KENALOG) 0.1 %, Apply 1 application topically 2 (two) times daily as needed (until rash resolves- apply sparingly and rub in well). , Disp: , Rfl:  .  zolpidem (AMBIEN) 10 MG tablet, Take 10 mg by mouth at bedtime as needed for sleep., Disp: , Rfl:   PAST MEDICAL HISTORY: Past Medical History:  Diagnosis Date  . HTN (hypertension)     PAST SURGICAL HISTORY: No past surgical history on file.  FAMILY  HISTORY: Family History  Family history unknown: Yes    SOCIAL HISTORY:  Social History   Socioeconomic History  . Marital status: Married    Spouse name: Not on file  . Number of children: Not on file  . Years of education: Not on file  . Highest education level: Not on file  Occupational History  . Not on file  Tobacco Use  . Smoking status: Current Some Day Smoker    Types: Cigarettes  . Smokeless tobacco: Never Used  Substance and Sexual Activity  . Alcohol use: No  . Drug use: No  . Sexual activity: Yes  Other Topics Concern  . Not on file  Social History Narrative  . Not on file   Social Determinants of Health   Financial Resource Strain:   . Difficulty of Paying Living Expenses: Not on file  Food Insecurity:   . Worried About Programme researcher, broadcasting/film/video in the Last Year: Not on file  . Ran Out of Food in the Last Year: Not on file  Transportation Needs:   . Lack of Transportation (Medical): Not on file  . Lack of Transportation (Non-Medical): Not on file  Physical Activity:   . Days of Exercise per Week: Not on file  . Minutes of Exercise per Session: Not on file  Stress:   . Feeling of Stress : Not on file  Social Connections:   . Frequency of Communication with Friends and Family: Not on file  . Frequency of Social Gatherings with Friends and Family: Not on file  . Attends Religious Services: Not on file  . Active Member of Clubs or Organizations: Not on file  . Attends Banker Meetings: Not on file  . Marital Status: Not on file  Intimate Partner Violence:   . Fear of Current or Ex-Partner: Not on file  . Emotionally Abused: Not on file  . Physically Abused: Not on file  . Sexually Abused: Not on file     PHYSICAL EXAM  Vitals:   04/26/19 0901  BP: 135/88  Pulse: 63  Temp: 97.9 F (36.6 C)  Weight: 201 lb (91.2 kg)  Height: 5\' 9"  (1.753 m)    Body mass index is 29.68 kg/m.   Hearing Screening   125Hz  250Hz  500Hz  1000Hz  2000Hz   3000Hz  4000Hz  6000Hz  8000Hz   Right ear:           Left ear:             Visual Acuity Screening   Right eye Left eye Both eyes  Without correction: 20/40 20/30 20/30   With correction:      General: The patient is well-developed and well-nourished and in no acute distress  HEENT:  Head is [place]/AT.  Sclera are anicteric.  Funduscopic exam show mild disc edema on the right and normal left optic disc and normal retinal vessels.  Neck: No carotid bruits are noted.  The neck is nontender.  Cardiovascular: The heart  has a regular rate and rhythm with a normal S1 and S2. There were no murmurs, gallops or rubs.    Skin: Extremities are without rash or edema.  Musculoskeletal:  Back is nontender  Neurologic Exam  Mental status: The patient is alert and oriented x 3 at the time of the examination. The patient has apparent normal recent and remote memory, with an apparently normal attention span and concentration ability.   Speech is normal.  Cranial nerves: Extraocular movements are full.  He has a 2+ relative APD on the right.  Color vision is reduced on the right.  Facial symmetry is present. There is good facial sensation to soft touch bilaterally.Facial strength is normal.  Trapezius and sternocleidomastoid strength is normal. No dysarthria is noted.  The tongue is midline, and the patient has symmetric elevation of the soft palate. No obvious hearing deficits are noted.  Motor:  Muscle bulk is normal.   Tone is normal. Strength is  5 / 5 in all 4 extremities.   Sensory: Sensory testing is intact to  soft touch and vibration sensation in all 4 extremities.  Coordination: Cerebellar testing reveals good finger-nose-finger and heel-to-shin bilaterally.  Gait and station: Station is normal.   Gait is normal. Tandem gait is normal. Romberg is negative.   Reflexes: Deep tendon reflexes are symmetric and normal bilaterally.   Plantar responses are flexor.    DIAGNOSTIC DATA (LABS, IMAGING,  TESTING) - I reviewed patient records, labs, notes, testing and imaging myself where available.  Lab Results  Component Value Date   WBC 6.8 04/15/2019   HGB 14.7 04/15/2019   HCT 43.4 04/15/2019   MCV 81.7 04/15/2019   PLT 299 04/15/2019      Component Value Date/Time   NA 139 04/15/2019 0050   K 3.4 (L) 04/15/2019 0050   CL 104 04/15/2019 0050   CO2 23 04/15/2019 0050   GLUCOSE 106 (H) 04/15/2019 0050   BUN 14 04/15/2019 0050   CREATININE 1.08 04/15/2019 0050   CALCIUM 8.8 (L) 04/15/2019 0050   PROT 7.3 09/22/2009 0625   ALBUMIN 4.5 09/22/2009 0625   AST 35 09/22/2009 0625   ALT 49 09/22/2009 0625   ALKPHOS 63 09/22/2009 0625   BILITOT 0.9 09/22/2009 0625   GFRNONAA >60 04/15/2019 0050   GFRAA >60 04/15/2019 0050        ASSESSMENT AND PLAN  Optic neuritis - Plan: Pan-ANCA, VITAMIN D 25 Hydroxy (Vit-D Deficiency, Fractures), B. burgdorfi antibodies, Angiotensin converting enzyme, ANA w/Reflex, Sedimentation rate, C-reactive protein  Insomnia, unspecified type   In summary, Mr. Krammes is a 54 year old man who developed optic neuritis 2 weeks ago and was found to have optic disc edema when evaluated by ophthalmology.  MRI of the orbits showed abnormal enhancement of the right optic nerve.  MRI of the brain and the spinal cord was normal.  I had a long discussion with Mr. Carter about the isolated optic neuritis.  Based on data from the large optic nerve treatment trial, the risk of developing MS in a patient who has optic neuritis but a normal brain MRI is about 25%.  We will check some blood work to rule out other possible causes of optic neuritis.  Then, I would recommend that in about 6 months he be reevaluated and we will check another MRI of the brain around that time.  If there are new lesions, then the likelihood that he has MS is much higher and we would consider a disease modifying  therapy.  If the MRI continues to be normal, then we will check another MRI about 1 year  later.  He will return to see me in 6 months or sooner if there are new or worsening neurologic symptoms.  Thank you for asking me to see Mr. Bringhurst.  Please let me know if I can be of further assistance with him or other patients in the future.   Mailee Klaas A. Felecia Shelling, MD, [facility] 09/20/3381, 2:91 AM Certified in Neurology, Clinical Neurophysiology, Sleep Medicine and Neuroimaging  [facility] [address] [place], [place] [address] [phone]

## 2019-04-27 LAB — ANA W/REFLEX: Anti Nuclear Antibody (ANA): NEGATIVE

## 2019-04-27 LAB — PAN-ANCA
ANCA Proteinase 3: <3.5 U/mL (ref 0.0–3.5)
Atypical pANCA: <1:20 {titer}
C-ANCA: <1:20 {titer}
Myeloperoxidase Ab: <9 U/mL (ref 0.0–9.0)
P-ANCA: <1:20 {titer}

## 2019-04-27 LAB — VITAMIN D 25 HYDROXY (VIT D DEFICIENCY, FRACTURES): Vit D, 25-Hydroxy: 12.7 ng/mL — ABNORMAL LOW (ref 30.0–100.0)

## 2019-04-27 LAB — ANGIOTENSIN CONVERTING ENZYME: Angio Convert Enzyme: 23 U/L (ref 14–82)

## 2019-04-27 LAB — C-REACTIVE PROTEIN: CRP: <1 mg/L (ref 0–10)

## 2019-04-27 LAB — B. BURGDORFI ANTIBODIES: Lyme IgG/IgM Ab: <0.91 {ISR} (ref 0.00–0.90)

## 2019-04-27 LAB — SEDIMENTATION RATE: Sed Rate: 14 mm/hr (ref 0–30)

## 2019-04-28 ENCOUNTER — Telehealth: Payer: Self-pay | Admitting: *Deleted

## 2019-04-28 DIAGNOSIS — R7989 Other specified abnormal findings of blood chemistry: Secondary | ICD-10-CM

## 2019-04-28 MED ORDER — VITAMIN D (ERGOCALCIFEROL) 1.25 MG (50000 UNIT) PO CAPS: ORAL_CAPSULE | ORAL | 1 refills | Status: DC

## 2019-04-28 NOTE — Telephone Encounter (Signed)
Called, LVM for pt to call about results.  

## 2019-04-28 NOTE — Telephone Encounter (Signed)
-----   Message from Asa Lente, MD sent at 04/27/2019  8:46 PM EST ----- Please let him know that the vitamin D was very low and I would like him to take 50,000 units weekly for 6 months and then he can start taking 5000 units over-the-counter daily        #13 #1

## 2019-04-28 NOTE — Telephone Encounter (Signed)
Spoke with the patient about his recent vitamin D levels. Patient verbalized understanding his results and is agreeable to start taking Vitamin D 50,000 units weekly for 6 months and then switching to 5,000 units daily. Patient would like to know could this medication be called into his pharmacy. Please advise.

## 2019-04-28 NOTE — Addendum Note (Signed)
Addended by: Arther Abbott on: 04/28/2019 03:51 PM   Modules accepted: Orders

## 2019-04-28 NOTE — Telephone Encounter (Signed)
Called pt to let him know prescription called into pharmacy on file. He verbalized understanding. I went over directions again with him.

## 2019-05-14 ENCOUNTER — Other Ambulatory Visit (HOSPITAL_COMMUNITY): Payer: Self-pay | Admitting: Psychiatry

## 2019-05-14 DIAGNOSIS — F419 Anxiety disorder, unspecified: Secondary | ICD-10-CM

## 2019-05-14 DIAGNOSIS — F330 Major depressive disorder, recurrent, mild: Secondary | ICD-10-CM

## 2019-05-14 MED ORDER — SERTRALINE HCL 50 MG PO TABS: 50.0000 mg | ORAL_TABLET | Freq: Every day | ORAL | 1 refills | Status: DC

## 2019-05-14 NOTE — Progress Notes (Signed)
Patient needs refill. Will do 30 days refill but need to be evaluated for future refills.Patient acknowledge and agree to schedule appointment.

## 2020-01-20 ENCOUNTER — Encounter: Payer: Self-pay | Admitting: Family Medicine

## 2020-01-20 ENCOUNTER — Other Ambulatory Visit: Payer: Self-pay

## 2020-01-20 ENCOUNTER — Ambulatory Visit: Payer: 59 | Admitting: Family Medicine

## 2020-01-20 VITALS — BP 138/70 | HR 85 | Temp 97.3°F | Ht 69.0 in | Wt 173.2 lb

## 2020-01-20 DIAGNOSIS — M25511 Pain in right shoulder: Secondary | ICD-10-CM

## 2020-01-20 DIAGNOSIS — N529 Male erectile dysfunction, unspecified: Secondary | ICD-10-CM | POA: Diagnosis not present

## 2020-01-20 DIAGNOSIS — Z1211 Encounter for screening for malignant neoplasm of colon: Secondary | ICD-10-CM | POA: Diagnosis not present

## 2020-01-20 DIAGNOSIS — G4700 Insomnia, unspecified: Secondary | ICD-10-CM

## 2020-01-20 DIAGNOSIS — E559 Vitamin D deficiency, unspecified: Secondary | ICD-10-CM

## 2020-01-20 DIAGNOSIS — R7989 Other specified abnormal findings of blood chemistry: Secondary | ICD-10-CM

## 2020-01-20 DIAGNOSIS — G8929 Other chronic pain: Secondary | ICD-10-CM

## 2020-01-20 DIAGNOSIS — M25562 Pain in left knee: Secondary | ICD-10-CM

## 2020-01-20 DIAGNOSIS — M25561 Pain in right knee: Secondary | ICD-10-CM

## 2020-01-20 LAB — TSH: TSH: <0.01 u[IU]/mL — ABNORMAL LOW (ref 0.35–4.50)

## 2020-01-20 LAB — T4, FREE: Free T4: 4.04 ng/dL — ABNORMAL HIGH (ref 0.60–1.60)

## 2020-01-20 MED ORDER — ZOLPIDEM TARTRATE 10 MG PO TABS: 10.0000 mg | ORAL_TABLET | Freq: Every evening | ORAL | 1 refills | Status: DC | PRN

## 2020-01-20 MED ORDER — VITAMIN D (ERGOCALCIFEROL) 1.25 MG (50000 UNIT) PO CAPS: ORAL_CAPSULE | ORAL | 1 refills | Status: AC

## 2020-01-20 MED ORDER — TADALAFIL 20 MG PO TABS: 20.0000 mg | ORAL_TABLET | Freq: Every day | ORAL | 5 refills | Status: DC | PRN

## 2020-01-20 NOTE — Progress Notes (Signed)
Patrick Duffy is a 54 y.o. male  Chief Complaint  Patient presents with  . Establish Care    establish care.  c/o having back and bilateral knee pain.     HPI: Patrick Duffy is a 54 y.o. male here to establish care with our office. He has not had a PCP in years.  He had recent labs done 12/2019 which he states he ordered himself online.  TSH - 0.01 Mother with hypothyroidism  Specialists: neurology (Dr. Epimenio Foot with [facility]) for optic neuritis, insomnia, derm (Dr. Margo Aye in [place]) - next appt tomorrow AM.  Last colonoscopy: never - needs refilled  Med refills needed today: yes per orders   Past Medical History:  Diagnosis Date  . HTN (hypertension)     History reviewed. No pertinent surgical history.  Social History   Socioeconomic History  . Marital status: Married    Spouse name: Not on file  . Number of children: Not on file  . Years of education: Not on file  . Highest education level: Not on file  Occupational History  . Not on file  Tobacco Use  . Smoking status: Current Some Day Smoker    Types: Cigarettes  . Smokeless tobacco: Never Used  Vaping Use  . Vaping Use: Never used  Substance and Sexual Activity  . Alcohol use: No  . Drug use: No  . Sexual activity: Yes  Other Topics Concern  . Not on file  Social History Narrative  . Not on file   Social Determinants of Health   Financial Resource Strain:   . Difficulty of Paying Living Expenses: Not on file  Food Insecurity:   . Worried About Programme researcher, broadcasting/film/video in the Last Year: Not on file  . Ran Out of Food in the Last Year: Not on file  Transportation Needs:   . Lack of Transportation (Medical): Not on file  . Lack of Transportation (Non-Medical): Not on file  Physical Activity:   . Days of Exercise per Week: Not on file  . Minutes of Exercise per Session: Not on file  Stress:   . Feeling of Stress : Not on file  Social Connections:   . Frequency of Communication with Friends and Family: Not on  file  . Frequency of Social Gatherings with Friends and Family: Not on file  . Attends Religious Services: Not on file  . Active Member of Clubs or Organizations: Not on file  . Attends Banker Meetings: Not on file  . Marital Status: Not on file  Intimate Partner Violence:   . Fear of Current or Ex-Partner: Not on file  . Emotionally Abused: Not on file  . Physically Abused: Not on file  . Sexually Abused: Not on file    Family History  Family history unknown: Yes     Immunization History  Administered Date(s) Administered  . Moderna SARS-COVID-2 Vaccination 10/20/2019, 11/18/2019    Outpatient Encounter Medications as of 01/20/2020  Medication Sig  . acetaminophen (TYLENOL) 500 MG tablet Take 500-1,000 mg by mouth every 8 (eight) hours as needed for mild pain.  Marland Kitchen aspirin-sod bicarb-citric acid (ALKA-SELTZER) 325 MG TBEF tablet Take 325 mg by mouth every 6 (six) hours as needed (for cold symptoms).  . Esomeprazole Magnesium (NEXIUM 24HR) 20 MG TBEC Take by mouth.  . fluticasone (CUTIVATE) 0.05 % cream SMARTSIG:Topical 1 to 2 Times Daily PRN  . tadalafil (CIALIS) 20 MG tablet Take 1 tablet (20 mg total) by mouth  daily as needed for erectile dysfunction.  . triamcinolone cream (KENALOG) 0.1 % Apply 1 application topically 2 (two) times daily as needed (until rash resolves- apply sparingly and rub in well).   Marland Kitchen zolpidem (AMBIEN) 10 MG tablet Take 1 tablet (10 mg total) by mouth at bedtime as needed for sleep.  . [DISCONTINUED] tadalafil (CIALIS) 20 MG tablet Take 20 mg by mouth daily as needed for erectile dysfunction.  . [DISCONTINUED] zolpidem (AMBIEN) 10 MG tablet Take 10 mg by mouth at bedtime as needed for sleep.  . Vitamin D, Ergocalciferol, (DRISDOL) 1.25 MG (50000 UNIT) CAPS capsule Take 1 capsule by mouth weekly for 6 months and then switch to Vit D 5000U OTC daily thereafter  . [DISCONTINUED] sertraline (ZOLOFT) 50 MG tablet Take 1 tablet (50 mg total) by mouth  daily. (Patient not taking: Reported on 01/20/2020)  . [DISCONTINUED] Vitamin D, Ergocalciferol, (DRISDOL) 1.25 MG (50000 UNIT) CAPS capsule Take 1 capsule by mouth weekly for 6 months and then switch to Vit D 5000U OTC daily thereafter (Patient not taking: Reported on 01/20/2020)   No facility-administered encounter medications on file as of 01/20/2020.     ROS: Pertinent positives and negatives noted in HPI. Remainder of ROS non-contributory    Allergies  Allergen Reactions  . Trazodone And Nefazodone Other (See Comments)    Caused the patient to be groggy the next morning   . Other Dermatitis and Rash    DYES (clothing)    BP 138/70   Pulse 85   Temp (!) 97.3 F (36.3 C) (Temporal)   Ht 5\' 9"  (1.753 m)   Wt 173 lb 3.2 oz (78.6 kg)   SpO2 98%   BMI 25.58 kg/m   Physical Exam Constitutional:      General: He is not in acute distress.    Appearance: Normal appearance. He is not ill-appearing.  Pulmonary:     Effort: No respiratory distress.  Neurological:     Mental Status: He is alert and oriented to person, place, and time.  Psychiatric:        Mood and Affect: Mood normal.        Behavior: Behavior normal.      A/P:  1. Screening for colon cancer - Ambulatory referral to Gastroenterology  2. Low vitamin D level Rx: - Vitamin D, Ergocalciferol, (DRISDOL) 1.25 MG (50000 UNIT) CAPS capsule; Take 1 capsule by mouth weekly for 6 months and then switch to Vit D 5000U OTC daily thereafter  Dispense: 13 capsule; Refill: 1  3. Erectile dysfunction, unspecified erectile dysfunction type Refill: - tadalafil (CIALIS) 20 MG tablet; Take 1 tablet (20 mg total) by mouth daily as needed for erectile dysfunction.  Dispense: 10 tablet; Refill: 5  4. Insomnia, unspecified type - database reviewed and appropriate Refill: - zolpidem (AMBIEN) 10 MG tablet; Take 1 tablet (10 mg total) by mouth at bedtime as needed for sleep.  Dispense: 30 tablet; Refill: 1  5. Low serum  thyroid stimulating hormone (TSH) - TSH - T4, free - T3 - Thyroid stimulating immunoglobulin - Thyroid peroxidase antibody  7. Chronic pain of both knees 8. Chronic right shoulder pain - Ambulatory referral to Orthopedic Surgery    This visit occurred during the SARS-CoV-2 public health emergency.  Safety protocols were in place, including screening questions prior to the visit, additional usage of staff PPE, and extensive cleaning of exam room while observing appropriate contact time as indicated for disinfecting solutions.

## 2020-01-20 NOTE — Patient Instructions (Signed)
Claritin, zyrtec, allegra 1 tab daily flonase 2 sprays each nostril daily Nasal saline spray 2-3x/day

## 2020-01-21 ENCOUNTER — Other Ambulatory Visit: Payer: Self-pay | Admitting: Family Medicine

## 2020-01-21 DIAGNOSIS — E0500 Thyrotoxicosis with diffuse goiter without thyrotoxic crisis or storm: Secondary | ICD-10-CM

## 2020-01-25 LAB — T3: T3, Total: 428 ng/dL — ABNORMAL HIGH (ref 76–181)

## 2020-01-25 LAB — THYROID PEROXIDASE ANTIBODY: Thyroperoxidase Ab SerPl-aCnc: >900 IU/mL — ABNORMAL HIGH (ref <9)

## 2020-01-25 LAB — THYROID STIMULATING IMMUNOGLOBULIN

## 2020-01-27 ENCOUNTER — Telehealth: Payer: Self-pay

## 2020-01-27 DIAGNOSIS — N529 Male erectile dysfunction, unspecified: Secondary | ICD-10-CM

## 2020-01-27 MED ORDER — TADALAFIL 20 MG PO TABS: 20.0000 mg | ORAL_TABLET | Freq: Every day | ORAL | 5 refills | Status: DC | PRN

## 2020-01-27 NOTE — Telephone Encounter (Signed)
Spoke to patient and he states that the Tadalafil  20 mg cost him $200 at [facility].  He would like to get a printed RX so that he can mail off for it to get a lower price.   Please advise. Thanks. Dm/cma

## 2020-01-27 NOTE — Addendum Note (Signed)
Addended by: Overton Mam on: 01/27/2020 02:06 PM   Modules accepted: Orders

## 2020-01-27 NOTE — Telephone Encounter (Signed)
Rx printed

## 2020-01-27 NOTE — Telephone Encounter (Signed)
lft VM that RX was printed and available for pickup at office.  Dm/cma

## 2020-03-20 ENCOUNTER — Encounter: Payer: Self-pay | Admitting: Endocrinology

## 2020-03-20 ENCOUNTER — Ambulatory Visit (INDEPENDENT_AMBULATORY_CARE_PROVIDER_SITE_OTHER): Payer: 59 | Admitting: Endocrinology

## 2020-03-20 ENCOUNTER — Other Ambulatory Visit: Payer: Self-pay | Admitting: Family Medicine

## 2020-03-20 ENCOUNTER — Other Ambulatory Visit: Payer: Self-pay

## 2020-03-20 DIAGNOSIS — E0590 Thyrotoxicosis, unspecified without thyrotoxic crisis or storm: Secondary | ICD-10-CM | POA: Insufficient documentation

## 2020-03-20 DIAGNOSIS — G4700 Insomnia, unspecified: Secondary | ICD-10-CM

## 2020-03-20 LAB — TSH: TSH: <0.01 u[IU]/mL — ABNORMAL LOW (ref 0.35–4.50)

## 2020-03-20 LAB — T4, FREE: Free T4: 4.21 ng/dL — ABNORMAL HIGH (ref 0.60–1.60)

## 2020-03-20 MED ORDER — METHIMAZOLE 10 MG PO TABS: 40.0000 mg | ORAL_TABLET | Freq: Two times a day (BID) | ORAL | 3 refills | Status: DC

## 2020-03-20 NOTE — Telephone Encounter (Signed)
Last OV 01/20/20 Last fill 01/20/20 #30/1

## 2020-03-20 NOTE — Patient Instructions (Signed)
Blood tests are requested for you today.  We'll let you know about the results.  If it is overactive again, I will send a prescription to your pharmacy, to slow it down If ever you have fever while taking this, stop it and call us, even if the reason is obvious, because of the risk of a rare side-effect. It is best to never miss the medication.  However, if you do miss it, next best is to double up the next time.  Please come back for a follow-up appointment in 1 month.

## 2020-03-20 NOTE — Progress Notes (Signed)
Subjective:    Patient ID: Patrick Duffy, male    DOB: [birth year], 55 y.o.   MRN: [id number]  HPI Pt is referred by Dr Barron Alvine, for hyperthyroidism.  Pt reports he was dx'ed with hyperthyroidism in 2021.  He has never been on therapy for this.  He has never had XRT to the anterior neck, or thyroid surgery.  He has never had thyroid imaging.  He does not consume kelp or any other non-prescribed thyroid medication.  He has never been on amiodarone.  He has lost 40 lbs x 4 months--unintentional.  He has palpitations and tremor.   Past Medical History:  Diagnosis Date   HTN (hypertension)     No past surgical history on file.  Social History   Socioeconomic History   Marital status: Married    Spouse name: Not on file   Number of children: Not on file   Years of education: Not on file   Highest education level: Not on file  Occupational History   Not on file  Tobacco Use   Smoking status: Current Some Day Smoker    Types: Cigarettes   Smokeless tobacco: Never Used  Vaping Use   Vaping Use: Never used  Substance and Sexual Activity   Alcohol use: No   Drug use: No   Sexual activity: Yes  Other Topics Concern   Not on file  Social History Narrative   Not on file   Social Determinants of Health   Financial Resource Strain: Not on file  Food Insecurity: Not on file  Transportation Needs: Not on file  Physical Activity: Not on file  Stress: Not on file  Social Connections: Not on file  Intimate Partner Violence: Not on file    Current Outpatient Medications on File Prior to Visit  Medication Sig Dispense Refill   acetaminophen (TYLENOL) 500 MG tablet Take 500-1,000 mg by mouth every 8 (eight) hours as needed for mild pain.     aspirin-sod bicarb-citric acid (ALKA-SELTZER) 325 MG TBEF tablet Take 325 mg by mouth every 6 (six) hours as needed (for cold symptoms).     Esomeprazole Magnesium 20 MG TBEC Take by mouth.     fluticasone (CUTIVATE) 0.05 %  cream SMARTSIG:Topical 1 to 2 Times Daily PRN     tadalafil (CIALIS) 20 MG tablet Take 1 tablet (20 mg total) by mouth daily as needed for erectile dysfunction. 10 tablet 5   triamcinolone cream (KENALOG) 0.1 % Apply 1 application topically 2 (two) times daily as needed (until rash resolves- apply sparingly and rub in well).      Vitamin D, Ergocalciferol, (DRISDOL) 1.25 MG (50000 UNIT) CAPS capsule Take 1 capsule by mouth weekly for 6 months and then switch to Vit D 5000U OTC daily thereafter 13 capsule 1   No current facility-administered medications on file prior to visit.    Allergies  Allergen Reactions   Trazodone And Nefazodone Other (See Comments)    Caused the patient to be groggy the next morning    Other Dermatitis and Rash    DYES (clothing)    Family History  Problem Relation Age of Onset   Thyroid disease Neg Hx     BP 138/78 (BP Location: Right Arm, Patient Position: Sitting, Cuff Size: Normal)    Pulse 79    Ht 5\' 9"  (1.753 m)    Wt 168 lb 6.4 oz (76.4 kg)    SpO2 95%    BMI 24.87 kg/m   Review of  Systems denies sob, excessive diaphoresis, anxiety, and heat intolerance.      Objective:   Physical Exam VS: see vs page GEN: no distress HEAD: head: no deformity eyes: no periorbital swelling, no proptosis external nose and ears are normal NECK: supple, thyroid is 10 x normal size--diffuse CHEST WALL: no deformity LUNGS: clear to auscultation CV: reg rate and rhythm, no murmur.  MUSCULOSKELETAL: gait is normal and steady EXTEMITIES: no deformity.  no leg edema NEURO:  readily moves all 4's.  sensation is intact to touch on all 4's.  Slight fine tremor of the hands.   SKIN:  Normal texture and temperature.  No rash or suspicious lesion is visible.  Not diaphoretic.   NODES:  None palpable at the neck.   PSYCH: alert, well-oriented.  Does not appear anxious nor depressed.     Lab Results  Component Value Date   TSH <0.01 (L) 03/20/2020   T3TOTAL 428  (H) 01/20/2020     I have reviewed outside records, and summarized: Pt was noted to have low TSH, and referred here.  He was also seen for visual loss, and optic neuritis was found.     Assessment & Plan:  Grave's Dz, new to me.  Hyperthyroidism: uncontrolled.  Due to severity, tapazole is chosen as rx'ed.  I have sent a prescription to your pharmacy, for tapazole.  Please come back for a follow-up appointment in 1 month.

## 2020-03-21 ENCOUNTER — Other Ambulatory Visit: Payer: Self-pay | Admitting: *Deleted

## 2020-03-21 ENCOUNTER — Telehealth: Payer: Self-pay | Admitting: Endocrinology

## 2020-03-21 MED ORDER — METHIMAZOLE 10 MG PO TABS
40.0000 mg | ORAL_TABLET | Freq: Two times a day (BID) | ORAL | 3 refills | Status: DC
Start: 2020-03-21 — End: 2020-04-20

## 2020-03-21 NOTE — Telephone Encounter (Signed)
Ok, I sent to [facility]

## 2020-03-21 NOTE — Telephone Encounter (Signed)
Pt called to request Methimazol refill. Prescription was sent to the wrong pharmacy and pt requests a prescription for 3 months due to him having to travel often.   PHARMACY: [id number] [place], [place] - [address] AT [facility] [address] Phone:  [phone]  Fax:  [phone]

## 2020-03-21 NOTE — Telephone Encounter (Signed)
Please see below.

## 2020-03-21 NOTE — Telephone Encounter (Signed)
Pt called to f/u on refill request - pt asking when he is due for follow up. There was no notation on last OV notes or lab results.

## 2020-03-22 NOTE — Telephone Encounter (Signed)
Rx sent. Pt needs q80mo f/u since he is on controlled substance

## 2020-03-23 NOTE — Addendum Note (Signed)
Addended by: Romero Belling on: 03/23/2020 07:22 PM   Modules accepted: Level of Service

## 2020-04-12 NOTE — Telephone Encounter (Signed)
I spoke to pt's wife, as pt was out of town.  Wife informed office that pt will call office back to schedule an office visit.

## 2020-04-18 ENCOUNTER — Ambulatory Visit: Payer: 59 | Admitting: Endocrinology

## 2020-04-20 ENCOUNTER — Other Ambulatory Visit: Payer: Self-pay

## 2020-04-20 ENCOUNTER — Ambulatory Visit (INDEPENDENT_AMBULATORY_CARE_PROVIDER_SITE_OTHER): Payer: 59 | Admitting: Endocrinology

## 2020-04-20 VITALS — BP 144/70 | HR 77 | Ht 69.0 in | Wt 167.4 lb

## 2020-04-20 DIAGNOSIS — E876 Hypokalemia: Secondary | ICD-10-CM

## 2020-04-20 DIAGNOSIS — R5383 Other fatigue: Secondary | ICD-10-CM | POA: Diagnosis not present

## 2020-04-20 DIAGNOSIS — E559 Vitamin D deficiency, unspecified: Secondary | ICD-10-CM | POA: Diagnosis not present

## 2020-04-20 DIAGNOSIS — E0590 Thyrotoxicosis, unspecified without thyrotoxic crisis or storm: Secondary | ICD-10-CM | POA: Diagnosis not present

## 2020-04-20 LAB — T4, FREE: Free T4: 1.22 ng/dL (ref 0.60–1.60)

## 2020-04-20 LAB — BASIC METABOLIC PANEL
BUN: 15 mg/dL (ref 6–23)
CO2: 25 mEq/L (ref 19–32)
Calcium: 9.7 mg/dL (ref 8.4–10.5)
Chloride: 105 mEq/L (ref 96–112)
Creatinine, Ser: 1.01 mg/dL (ref 0.40–1.50)
GFR: 85.06 mL/min (ref >60.00)
Glucose, Bld: 108 mg/dL — ABNORMAL HIGH (ref 70–99)
Potassium: 4.1 mEq/L (ref 3.5–5.1)
Sodium: 136 mEq/L (ref 135–145)

## 2020-04-20 LAB — TSH: TSH: <0.01 u[IU]/mL — ABNORMAL LOW (ref 0.35–4.50)

## 2020-04-20 MED ORDER — METHIMAZOLE 10 MG PO TABS: 10.0000 mg | ORAL_TABLET | Freq: Two times a day (BID) | ORAL | 3 refills | Status: DC

## 2020-04-20 NOTE — Patient Instructions (Addendum)
Blood tests are requested for you today.  We'll let you know about the results.  If ever you have fever while taking methimazole, stop it and call us, even if the reason is obvious, because of the risk of a rare side-effect. It is best to never miss the medication.  However, if you do miss it, next best is to double up the next time.   Please come back for a follow-up appointment in 1 month.   

## 2020-04-20 NOTE — Progress Notes (Signed)
Subjective:    Patient ID: Patrick Duffy, male    DOB: 08-01-1965, 55 y.o.   MRN: [id number]  HPI Pt returns for f/u of hyperthyroidism (dx'ed 2021; tapazole is chosen as initial rx, due to severity; he has never had thyroid imaging).  palpitations and tremor are less now, but he has leg cramps.  Fatigue persists.   Past Medical History:  Diagnosis Date  . HTN (hypertension)     No past surgical history on file.  Social History   Socioeconomic History  . Marital status: Married    Spouse name: Not on file  . Number of children: Not on file  . Years of education: Not on file  . Highest education level: Not on file  Occupational History  . Not on file  Tobacco Use  . Smoking status: Current Some Day Smoker    Types: Cigarettes  . Smokeless tobacco: Never Used  Vaping Use  . Vaping Use: Never used  Substance and Sexual Activity  . Alcohol use: No  . Drug use: No  . Sexual activity: Yes  Other Topics Concern  . Not on file  Social History Narrative  . Not on file   Social Determinants of Health   Financial Resource Strain: Not on file  Food Insecurity: Not on file  Transportation Needs: Not on file  Physical Activity: Not on file  Stress: Not on file  Social Connections: Not on file  Intimate Partner Violence: Not on file    Current Outpatient Medications on File Prior to Visit  Medication Sig Dispense Refill  . acetaminophen (TYLENOL) 500 MG tablet Take 500-1,000 mg by mouth every 8 (eight) hours as needed for mild pain.    Marland Kitchen aspirin-sod bicarb-citric acid (ALKA-SELTZER) 325 MG TBEF tablet Take 325 mg by mouth every 6 (six) hours as needed (for cold symptoms).    . Esomeprazole Magnesium 20 MG TBEC Take by mouth.    . fluticasone (CUTIVATE) 0.05 % cream SMARTSIG:Topical 1 to 2 Times Daily PRN    . tadalafil (CIALIS) 20 MG tablet Take 1 tablet (20 mg total) by mouth daily as needed for erectile dysfunction. 10 tablet 5  . triamcinolone cream (KENALOG) 0.1 % Apply 1  application topically 2 (two) times daily as needed (until rash resolves- apply sparingly and rub in well).     . Vitamin D, Ergocalciferol, (DRISDOL) 1.25 MG (50000 UNIT) CAPS capsule Take 1 capsule by mouth weekly for 6 months and then switch to Vit D 5000U OTC daily thereafter 13 capsule 1  . zolpidem (AMBIEN) 10 MG tablet TAKE 1 TABLET(10 MG) BY MOUTH AT BEDTIME AS NEEDED FOR SLEEP 30 tablet 2   No current facility-administered medications on file prior to visit.    Allergies  Allergen Reactions  . Trazodone And Nefazodone Other (See Comments)    Caused the patient to be groggy the next morning   . Other Dermatitis and Rash    DYES (clothing)    Family History  Problem Relation Age of Onset  . Thyroid disease Neg Hx     BP (!) 144/70 (BP Location: Right Arm, Patient Position: Sitting, Cuff Size: Normal)   Pulse 77   Ht 5\' 9"  (1.753 m)   Wt 167 lb 6.4 oz (75.9 kg)   SpO2 98%   BMI 24.72 kg/m    Review of Systems Denies fever    Objective:   Physical Exam VITAL SIGNS:  See vs page GENERAL: no distress NECK: thyroid is 3-5  times normal size (R>L), diffuse  Lab Results  Component Value Date   TSH <0.01 (L) 04/20/2020   T3TOTAL 428 (H) 01/20/2020      Assessment & Plan:  Fatigue: check testosterone Cramps: check BMET Hyperthyroidism: improved.  reduce methimazole to 1 pill, twice a day.

## 2020-04-21 ENCOUNTER — Telehealth: Payer: Self-pay | Admitting: Endocrinology

## 2020-04-21 NOTE — Telephone Encounter (Signed)
Pt requests a call regarding his most recent lab results along with instructions for taking his medication. Pt also wants to see what to do regarding the cramping he is having.  Ph# [phone]

## 2020-04-22 LAB — TESTOSTERONE,FREE AND TOTAL
Testosterone, Free: 7.2 pg/mL (ref 7.2–24.0)
Testosterone: 1016 ng/dL — ABNORMAL HIGH (ref 264–916)

## 2020-04-24 NOTE — Telephone Encounter (Signed)
Spoke with pt regarding response from [place] regarding leg cramps.

## 2020-04-24 NOTE — Telephone Encounter (Signed)
No cause was found, so please ask primary care provider

## 2020-04-24 NOTE — Telephone Encounter (Signed)
Spoke with pt regarding Lab results and he also stated that he has been experiencing some leg cramps in the night time ans want to know what he should do.  Please Advise

## 2020-05-24 ENCOUNTER — Ambulatory Visit: Payer: 59 | Admitting: Endocrinology

## 2020-07-07 ENCOUNTER — Other Ambulatory Visit: Payer: Self-pay | Admitting: Family Medicine

## 2020-07-07 DIAGNOSIS — G4700 Insomnia, unspecified: Secondary | ICD-10-CM

## 2020-07-10 NOTE — Telephone Encounter (Signed)
Rx refilled x 30 days. Pt needs appt prior to next refill

## 2020-07-10 NOTE — Telephone Encounter (Signed)
Last OV 01/20/20 Last fill 03/22/20  #30/2

## 2020-07-18 NOTE — Telephone Encounter (Signed)
Follow up appt set.

## 2020-07-24 NOTE — Patient Instructions (Signed)
Health Maintenance Due  Topic Date Due  . Pneumococcal Vaccine 25-54 Years old (1 of 2 - PPSV23) Never done  . Hepatitis C Screening  Never done  . TETANUS/TDAP  Never done  . COLONOSCOPY (Pts 45-13yrs Insurance coverage will need to be confirmed)  Never done  . Zoster Vaccines- Shingrix (1 of 2) Never done  . COVID-19 Vaccine (3 - Booster for Moderna series) 04/17/2020    Depression screen PHQ 2/9 01/20/2020  Decreased Interest 0  Down, Depressed, Hopeless 0  PHQ - 2 Score 0

## 2020-07-26 ENCOUNTER — Other Ambulatory Visit: Payer: Self-pay

## 2020-07-26 ENCOUNTER — Ambulatory Visit (INDEPENDENT_AMBULATORY_CARE_PROVIDER_SITE_OTHER): Payer: 59 | Admitting: Family Medicine

## 2020-07-26 ENCOUNTER — Encounter: Payer: Self-pay | Admitting: Family Medicine

## 2020-07-26 VITALS — BP 124/80 | HR 91 | Temp 98.7°F | Ht 69.0 in | Wt 175.4 lb

## 2020-07-26 DIAGNOSIS — Z79899 Other long term (current) drug therapy: Secondary | ICD-10-CM | POA: Diagnosis not present

## 2020-07-26 DIAGNOSIS — N529 Male erectile dysfunction, unspecified: Secondary | ICD-10-CM | POA: Diagnosis not present

## 2020-07-26 DIAGNOSIS — G4700 Insomnia, unspecified: Secondary | ICD-10-CM

## 2020-07-26 MED ORDER — ZOLPIDEM TARTRATE 10 MG PO TABS
10.0000 mg | ORAL_TABLET | Freq: Every day | ORAL | 2 refills | Status: DC
Start: 2020-07-26 — End: 2020-12-18

## 2020-07-26 MED ORDER — TADALAFIL 20 MG PO TABS: 20.0000 mg | ORAL_TABLET | Freq: Every day | ORAL | 5 refills | Status: DC | PRN

## 2020-07-26 NOTE — Progress Notes (Signed)
Patrick Duffy is a 55 y.o. male  Chief Complaint  Patient presents with  . Follow-up    Medication follow up    HPI: Patrick Duffy is a 55 y.o. male patient who is seen today for routine f/u on insomnia. He takes ambien 5-10mg  qHS. He uses with mainly when he flies or travels to different time zone. Med is effective. No side effects.    Past Medical History:  Diagnosis Date  . HTN (hypertension)     History reviewed. No pertinent surgical history.  Social History   Socioeconomic History  . Marital status: Married    Spouse name: Not on file  . Number of children: Not on file  . Years of education: Not on file  . Highest education level: Not on file  Occupational History  . Not on file  Tobacco Use  . Smoking status: Current Some Day Smoker    Types: Cigarettes  . Smokeless tobacco: Never Used  Vaping Use  . Vaping Use: Never used  Substance and Sexual Activity  . Alcohol use: No  . Drug use: No  . Sexual activity: Yes  Other Topics Concern  . Not on file  Social History Narrative  . Not on file   Social Determinants of Health   Financial Resource Strain: Not on file  Food Insecurity: Not on file  Transportation Needs: Not on file  Physical Activity: Not on file  Stress: Not on file  Social Connections: Not on file  Intimate Partner Violence: Not on file    Family History  Problem Relation Age of Onset  . Thyroid disease Neg Hx      Immunization History  Administered Date(s) Administered  . Moderna Sars-Covid-2 Vaccination 10/20/2019, 11/18/2019    Outpatient Encounter Medications as of 07/26/2020  Medication Sig  . acetaminophen (TYLENOL) 500 MG tablet Take 500-1,000 mg by mouth every 8 (eight) hours as needed for mild pain.  Marland Kitchen aspirin-sod bicarb-citric acid (ALKA-SELTZER) 325 MG TBEF tablet Take 325 mg by mouth every 6 (six) hours as needed (for cold symptoms).  . Esomeprazole Magnesium 20 MG TBEC Take by mouth.  . fluticasone (CUTIVATE) 0.05 % cream  SMARTSIG:Topical 1 to 2 Times Daily PRN  . methimazole (TAPAZOLE) 10 MG tablet Take 1 tablet (10 mg total) by mouth 2 (two) times daily.  Marland Kitchen triamcinolone cream (KENALOG) 0.1 % 1 application to affected area  . Vitamin D, Ergocalciferol, (DRISDOL) 1.25 MG (50000 UNIT) CAPS capsule Take 1 capsule by mouth weekly for 6 months and then switch to Vit D 5000U OTC daily thereafter  . [DISCONTINUED] ALPRAZolam (XANAX) 1 MG tablet   . [DISCONTINUED] azithromycin (ZITHROMAX) 250 MG tablet Take 250 mg by mouth as directed.  . [DISCONTINUED] predniSONE (STERAPRED UNI-PAK 48 TAB) 10 MG (48) TBPK tablet See admin instructions.  . [DISCONTINUED] tadalafil (CIALIS) 20 MG tablet Take 1 tablet (20 mg total) by mouth daily as needed for erectile dysfunction.  . [DISCONTINUED] zolpidem (AMBIEN) 10 MG tablet Take 1 tablet by mouth at bedtime.  . tadalafil (CIALIS) 20 MG tablet Take 1 tablet (20 mg total) by mouth daily as needed for erectile dysfunction.  Marland Kitchen zolpidem (AMBIEN) 10 MG tablet Take 1 tablet (10 mg total) by mouth at bedtime.  . [DISCONTINUED] predniSONE (STERAPRED UNI-PAK 48 TAB) 10 MG (48) TBPK tablet   . [DISCONTINUED] triamcinolone cream (KENALOG) 0.1 % Apply 1 application topically 2 (two) times daily as needed (until rash resolves- apply sparingly and rub in well).   . [  DISCONTINUED] zolpidem (AMBIEN) 10 MG tablet TAKE 1 TABLET(10 MG) BY MOUTH AT BEDTIME AS NEEDED FOR SLEEP  . [DISCONTINUED] zolpidem (AMBIEN) 10 MG tablet    No facility-administered encounter medications on file as of 07/26/2020.     ROS: Pertinent positives and negatives noted in HPI. Remainder of ROS non-contributory    Allergies  Allergen Reactions  . Trazodone And Nefazodone Other (See Comments)    Caused the patient to be groggy the next morning   . Other Dermatitis and Rash    DYES (clothing)    BP 124/80   Pulse 91   Temp 98.7 F (37.1 C)   Ht 5\' 9"  (1.753 m)   Wt 175 lb 6.4 oz (79.6 kg)   SpO2 97%   BMI  25.90 kg/m   Wt Readings from Last 3 Encounters:  07/26/20 175 lb 6.4 oz (79.6 kg)  04/20/20 167 lb 6.4 oz (75.9 kg)  03/20/20 168 lb 6.4 oz (76.4 kg)   Temp Readings from Last 3 Encounters:  07/26/20 98.7 F (37.1 C)  01/20/20 (!) 97.3 F (36.3 C) (Temporal)  04/26/19 97.9 F (36.6 C)   BP Readings from Last 3 Encounters:  07/26/20 124/80  04/20/20 (!) 144/70  03/20/20 138/78   Pulse Readings from Last 3 Encounters:  07/26/20 91  04/20/20 77  03/20/20 79     Physical Exam Constitutional:      General: He is not in acute distress.    Appearance: Normal appearance. He is not ill-appearing.  Cardiovascular:     Rate and Rhythm: Normal rate and regular rhythm.     Pulses: Normal pulses.     Heart sounds: Normal heart sounds.  Pulmonary:     Effort: Pulmonary effort is normal. No respiratory distress.     Breath sounds: No wheezing or rhonchi.  Musculoskeletal:     Right lower leg: No edema.     Left lower leg: No edema.  Neurological:     Mental Status: He is alert and oriented to person, place, and time.  Psychiatric:        Behavior: Behavior normal.      A/P:  1. Insomnia, unspecified type - stable, controlled - database reviewed and appropriate - UDS today - DRUG MONITORING, PANEL 8 WITH CONFIRMATION, URINE Refill: - zolpidem (AMBIEN) 10 MG tablet; Take 1 tablet (10 mg total) by mouth at bedtime.  Dispense: 30 tablet; Refill: 2 - f/u in 15mo or sooner PRN  2. Erectile dysfunction, unspecified erectile dysfunction type Refill: - tadalafil (CIALIS) 20 MG tablet; Take 1 tablet (20 mg total) by mouth daily as needed for erectile dysfunction.  Dispense: 10 tablet; Refill: 5  3. High risk medications (not anticoagulants) long-term use - DRUG MONITORING, PANEL 8 WITH CONFIRMATION, URINE   This visit occurred during the SARS-CoV-2 public health emergency.  Safety protocols were in place, including screening questions prior to the visit, additional usage of  staff PPE, and extensive cleaning of exam room while observing appropriate contact time as indicated for disinfecting solutions.

## 2020-07-29 LAB — DRUG MONITORING, PANEL 8 WITH CONFIRMATION, URINE
6 Acetylmorphine: NEGATIVE ng/mL (ref <10)
Alcohol Metabolites: NEGATIVE ng/mL
Amphetamines: NEGATIVE ng/mL (ref <500)
Benzodiazepines: NEGATIVE ng/mL (ref <100)
Buprenorphine, Urine: NEGATIVE ng/mL (ref <5)
Cocaine Metabolite: NEGATIVE ng/mL (ref <150)
Creatinine: 207.5 mg/dL
MDMA: NEGATIVE ng/mL (ref <500)
Marijuana Metabolite: 251 ng/mL — ABNORMAL HIGH (ref <5)
Marijuana Metabolite: POSITIVE ng/mL — AB (ref <20)
Opiates: NEGATIVE ng/mL (ref <100)
Oxidant: NEGATIVE ug/mL
Oxycodone: NEGATIVE ng/mL (ref <100)
pH: 6 (ref 4.5–9.0)

## 2020-07-29 LAB — DM TEMPLATE

## 2020-08-04 ENCOUNTER — Other Ambulatory Visit (HOSPITAL_COMMUNITY): Payer: Self-pay | Admitting: Psychiatry

## 2020-08-04 DIAGNOSIS — F330 Major depressive disorder, recurrent, mild: Secondary | ICD-10-CM

## 2020-08-04 DIAGNOSIS — F419 Anxiety disorder, unspecified: Secondary | ICD-10-CM

## 2020-08-21 IMAGING — MR MR HEAD WO/W CM
9 of 11 series · 36 of 48 positions shown · IV contrast (gadavist)
Comparison: None.

CLINICAL DATA: Right eye vision loss with optic nerve swelling.
Concern for optic neuritis.

EXAM:
MRI HEAD AND ORBITS WITHOUT AND WITH CONTRAST
TECHNIQUE: Multiplanar, multiecho pulse sequences of the brain and surrounding
structures were obtained without and with intravenous contrast.
Multiplanar, multiecho pulse sequences of the orbits and surrounding
structures were obtained including fat saturation techniques, before
and after intravenous contrast administration.
CONTRAST:  8mL GADAVIST GADOBUTROL 1 MMOL/ML IV SOLN

[Series 5: T1 · sagittal · 5.0mm · 0.75mm/px · 1 of 23 slices shown]
[im 1/23]
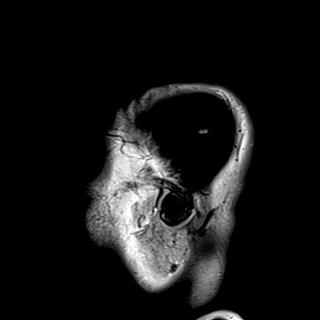

[Series 6: T2 · axial · 5.0mm · 0.72mm/px · z∈[-79,+65]mm · 3 of 25 slices shown]
[im 1/25]
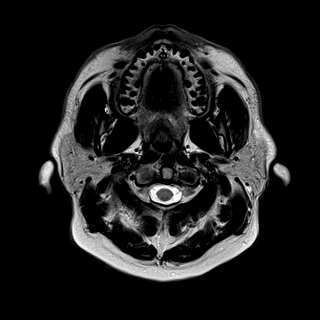
[im 13/25]
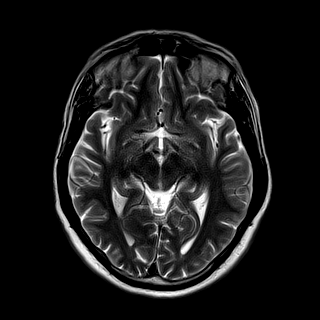
[im 25/25]
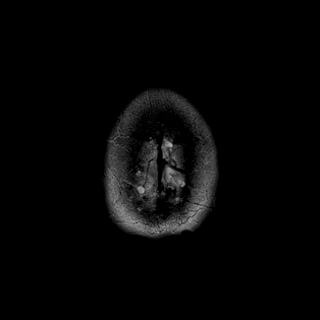

[Series 7: FLAIR · axial · 5.0mm · 0.45mm/px · z∈[-80,+64]mm · 3 of 25 slices shown]
[im 1/25]
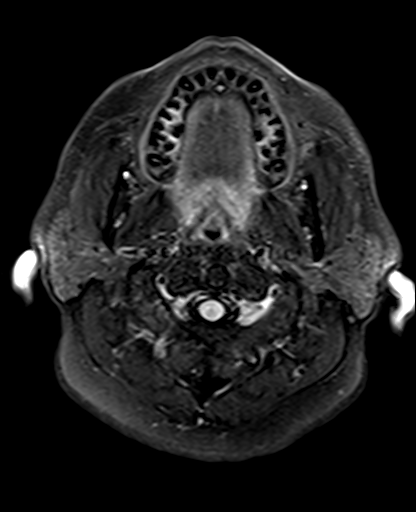
[im 13/25]
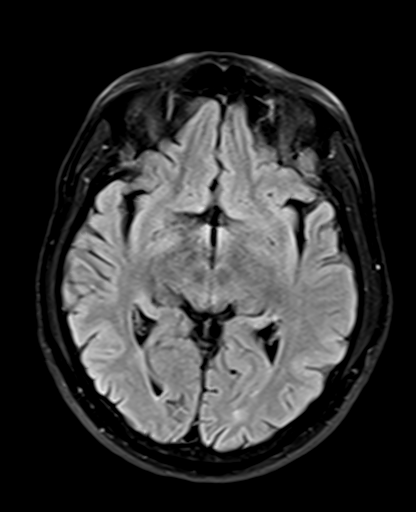
[im 25/25]
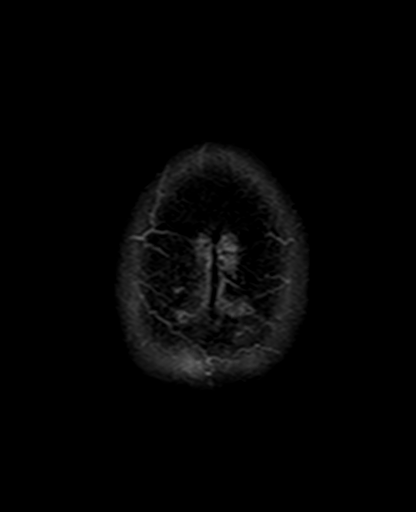

[Series 8: mag_images · axial · 3.0mm · 0.90mm/px · z∈[-90,+87]mm · 6 of 60 slices shown]
[im 1/60]
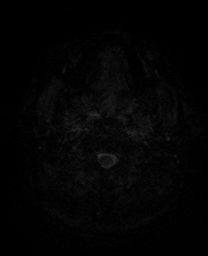
[im 12/60]
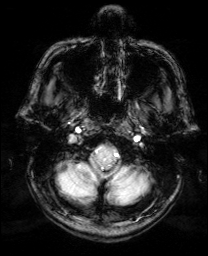
[im 24/60]
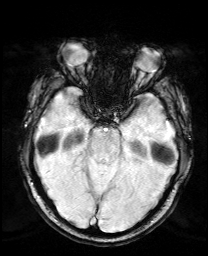
[im 36/60]
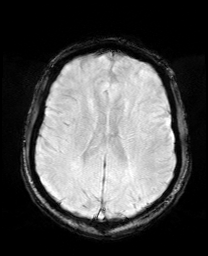
[im 48/60]
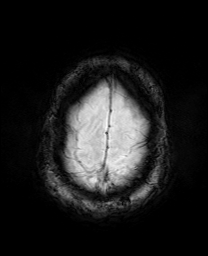
[im 60/60]
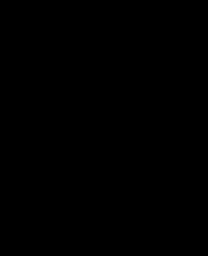

[Series 9: pha_images · axial · 3.0mm · 0.90mm/px · z∈[-90,+87]mm · 6 of 59 slices shown]
[im 1/59]
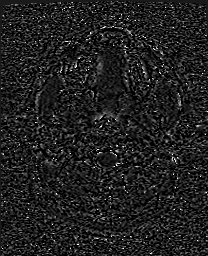
[im 12/59]
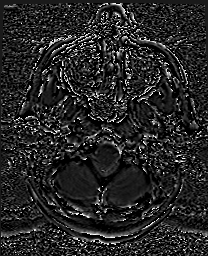
[im 24/59]
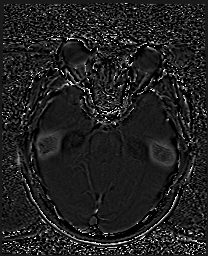
[im 35/59]
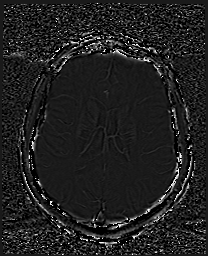
[im 47/59]
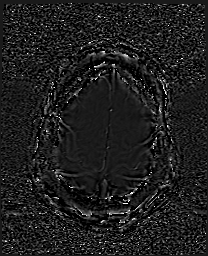
[im 59/59]
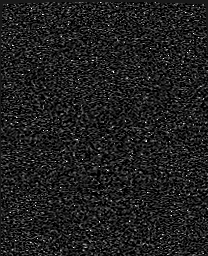

[Series 10: swi_images · axial · 3.0mm · 0.90mm/px · z∈[-90,+87]mm · 6 of 60 slices shown]
[im 1/60]
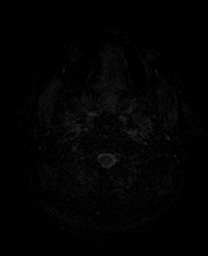
[im 12/60]
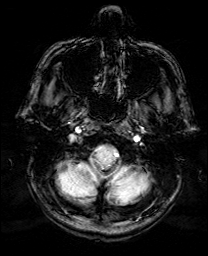
[im 24/60]
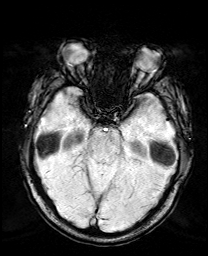
[im 36/60]
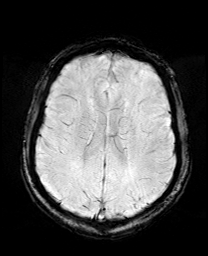
[im 48/60]
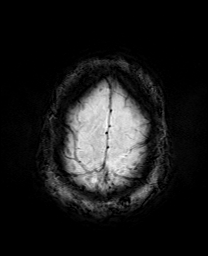
[im 60/60]
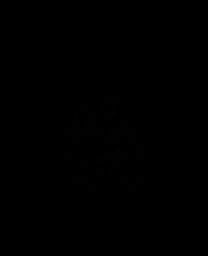

[Series 11: mip_images(sw) · axial · 24.0mm · 0.90mm/px · z∈[-79,+77]mm · 5 of 53 slices shown]
[im 1/53]
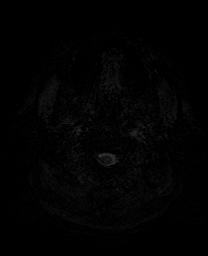
[im 14/53]
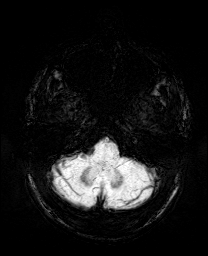
[im 27/53]
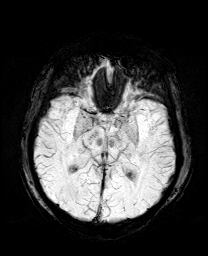
[im 40/53]
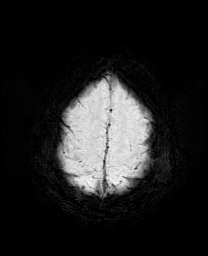
[im 53/53]
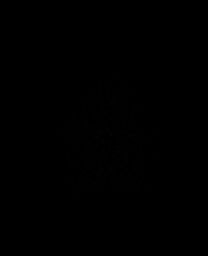

[Series 13: T2 post-contrast · coronal · 5.0mm · 0.72mm/px · 3 of 26 slices shown]
[im 1/26]
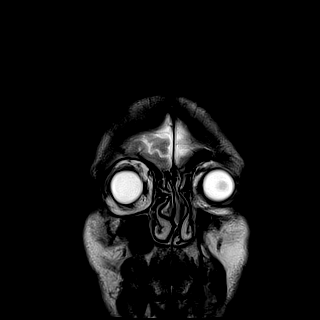
[im 13/26]
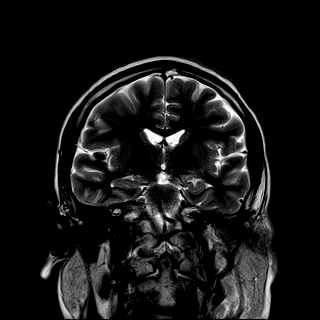
[im 26/26]
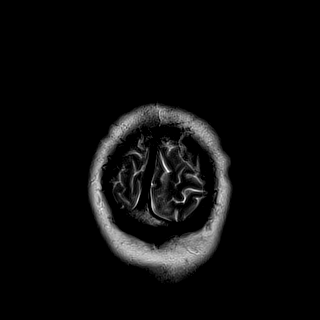

[Series 15: T1 post-contrast · coronal · 5.0mm · 0.34mm/px · 3 of 26 slices shown]
[im 1/26]
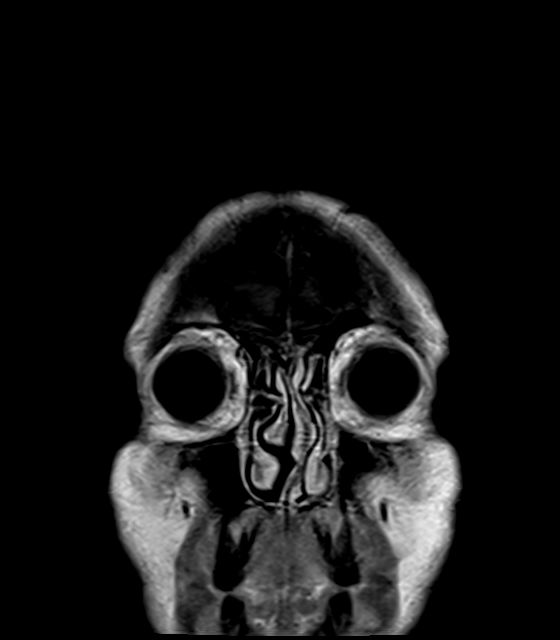
[im 13/26]
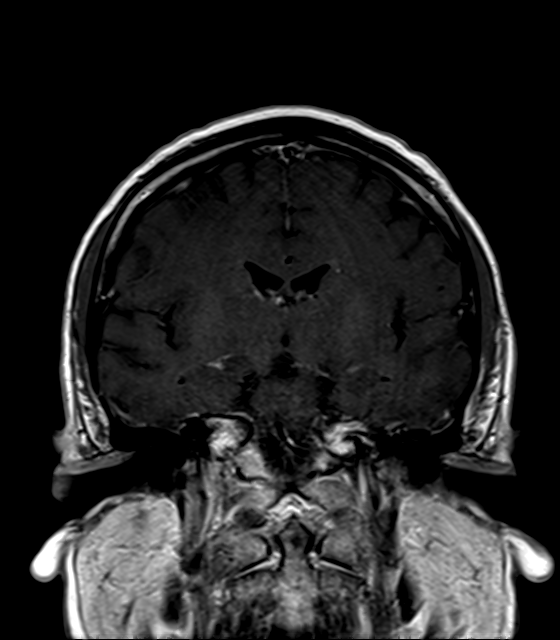
[im 26/26]
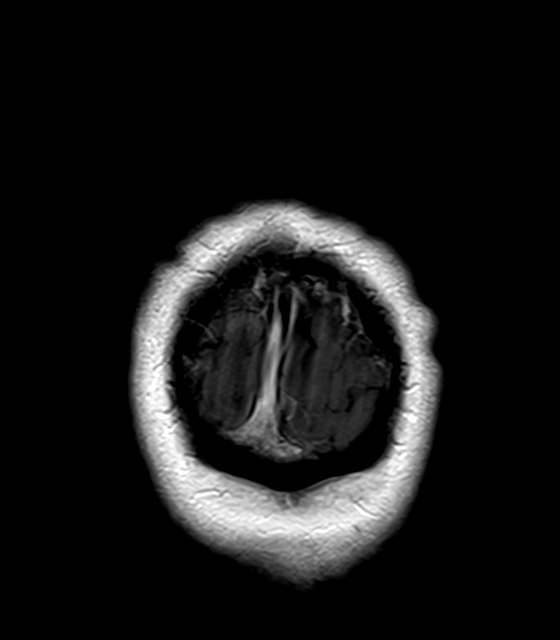

[36 of 48 positions shown; findings below may reference images not displayed]

FINDINGS: MRI HEAD FINDINGS

Brain: There is no evidence of acute infarct, intracranial
hemorrhage, mass, midline shift, or extra-axial fluid collection.
The ventricles and sulci are normal. The brain is normal in signal.
No abnormal enhancement is identified.

Vascular: Major intracranial vascular flow voids are preserved.
Normal enhancement of the dural venous sinuses.

Skull and upper cervical spine: Unremarkable bone marrow signal.

Other: None.

MRI ORBITS FINDINGS

Orbits: Image quality is degraded by motion artifact, however there
is abnormal enhancement of much of the intraorbital portion of the
right optic nerve as well as abnormal enhancement surrounding the
nerve, and there is also abnormal enhancement of the anterior aspect
of the left intraorbital optic nerve. The globes are symmetric and
appear intact. No orbital mass is identified. The extraocular
muscles and lacrimal glands are unremarkable.

Visualized sinuses: Paranasal sinuses and mastoid air cells are
clear.

Soft tissues: None.

Limited intracranial: Unremarkable appearance of the optic chiasm,
cavernous sinuses, and pituitary gland.
IMPRESSION: 1. Abnormal enhancement of the right greater than left optic nerves
consistent with optic neuritis.
2. Unremarkable appearance of the brain.

## 2020-12-18 ENCOUNTER — Telehealth: Payer: Self-pay | Admitting: Family Medicine

## 2020-12-18 ENCOUNTER — Other Ambulatory Visit: Payer: Self-pay | Admitting: Family

## 2020-12-18 DIAGNOSIS — G4700 Insomnia, unspecified: Secondary | ICD-10-CM

## 2020-12-18 MED ORDER — ZOLPIDEM TARTRATE 10 MG PO TABS: 10.0000 mg | ORAL_TABLET | Freq: Every day | ORAL | 2 refills | Status: DC

## 2020-12-27 ENCOUNTER — Other Ambulatory Visit (INDEPENDENT_AMBULATORY_CARE_PROVIDER_SITE_OTHER): Payer: Self-pay | Admitting: Internal Medicine

## 2021-03-09 ENCOUNTER — Telehealth: Payer: Self-pay | Admitting: Family Medicine

## 2021-03-09 ENCOUNTER — Encounter: Payer: Self-pay | Admitting: Family Medicine

## 2021-03-09 NOTE — Telephone Encounter (Signed)
Pt did not show up for his appointment

## 2021-03-15 ENCOUNTER — Encounter: Payer: Self-pay | Admitting: Family Medicine

## 2021-03-15 NOTE — Telephone Encounter (Signed)
1st no show for [facility], Dr. Salena Saner pt, waived fee, mailed letter KO 1/26

## 2021-07-31 ENCOUNTER — Other Ambulatory Visit (HOSPITAL_COMMUNITY): Payer: Self-pay | Admitting: Psychiatry

## 2021-07-31 ENCOUNTER — Telehealth (HOSPITAL_COMMUNITY): Payer: Self-pay

## 2021-07-31 MED ORDER — SERTRALINE HCL 25 MG PO TABS: 25.0000 mg | ORAL_TABLET | Freq: Two times a day (BID) | ORAL | 0 refills | Status: DC

## 2021-07-31 NOTE — Progress Notes (Signed)
Patient called and requested a refill of Zoloft 25 mg twice a day.  He has not seen since June 2020.  Like to reestablish his care.  We will provide Zoloft 25 mg twice a day until his next appointment on July 3.  Recommended to keep the appointment for future refills.

## 2021-07-31 NOTE — Telephone Encounter (Signed)
Medication management - Message left for patient, after he left one that he is in need of a new Sertraline order, to inform Dr. Lolly Mustache sent in a new order for #40 tablets which should last him until appt set for 08/20/21.  Encouraged pt to keep that appt and to call back if any issues filling order before the 08/20/21 appointment.

## 2021-08-17 ENCOUNTER — Encounter (HOSPITAL_COMMUNITY): Payer: Self-pay

## 2021-08-17 NOTE — Telephone Encounter (Signed)
Opened in error

## 2021-08-20 ENCOUNTER — Encounter (HOSPITAL_COMMUNITY): Payer: Self-pay | Admitting: Psychiatry

## 2021-08-20 ENCOUNTER — Telehealth (HOSPITAL_BASED_OUTPATIENT_CLINIC_OR_DEPARTMENT_OTHER): Payer: Self-pay | Admitting: Psychiatry

## 2021-08-20 VITALS — Wt 190.0 lb

## 2021-08-20 DIAGNOSIS — F418 Other specified anxiety disorders: Secondary | ICD-10-CM

## 2021-08-20 NOTE — Progress Notes (Signed)
Virtual Visit via Telephone Note  I connected with Patrick Duffy on 08/20/21 at  4:00 PM EDT by telephone and verified that I am speaking with the correct person using two identifiers.  Location: Patient: Home Provider: Home Office   I discussed the limitations, risks, security and privacy concerns of performing an evaluation and management service by telephone and the availability of in person appointments. I also discussed with the patient that there may be a patient responsible charge related to this service. The patient expressed understanding and agreed to proceed.   History of Present Illness: Patient is evaluated by phone session.  His last appointment was in 2020.  Patient told he has been taking periodically Zoloft has going through a lot of stress.  Recently his mother was very sick and required hospitalization and now discharged on home oxygen.  His brother is having significant issues with his wife and now currently his mother is separated.  Patient told his brother was living with him and he missed the nephews who are now living in [place].  Patient is not sure if there will be any reconciliation between his brother and his wife.  Patient also has some marital issues but currently stable and manageable.  Two years ago he had episode of optic neuritis and given steroids and last year he was treated for hyperactive thyroid with methimazole.  He admitted lately stressed about his work and not able to find good balance between his personal life and his work.  He denies any mania, psychosis, hallucination.  Currently he is not taking his medication and like to see if without medicine he can cope better.  He feels proud that he is not taking any hypnotics or any sleep medication.  His appetite is back and his weight is much better than he had ago.  He has no tremors, shakes or any EPS.  He denies drinking or using any illegal substances.  Lives with his wife but does spend quite a lot of time  with his mother.  Sometimes he feels sad because his brother does not talk to him even though he is supporting him and living in patient's house.  He reported sometimes family stress but so far things are manageable as trying to spend more time with the mother who is recovering from respiratory failure.    Past Psychiatric History: Reviewed. H/O depression and anxiety and self medicating with hypnotics and required in patient at [facility] in 2011. Took Seroquel, doxepin, trazodone , Cymbalta , Wellbutrin, lorazepam, trazodone, Ambien for insomnia. H/O Xanax abuse and agreed for treatment in [place] in 2010 but left AMA after 24-48 hours. H/O sleepwalking with Ambien and also questionable seizures due to withdrawal of benzodiazepine.  No h/o suicidal attempt, psychosis or mania.      Psychiatric Specialty Exam: Physical Exam  Review of Systems  Weight 190 lb (86.2 kg).There is no height or weight on file to calculate BMI.  General Appearance: NA  Eye Contact:  NA  Speech:  Clear and Coherent and Normal Rate  Volume:  Normal  Mood:  Anxious  Affect:  NA  Thought Process:  Goal Directed  Orientation:  Full (Time, Place, and Person)  Thought Content:  Rumination  Suicidal Thoughts:  No  Homicidal Thoughts:  No  Memory:  Immediate;   Good Recent;   Good Remote;   Good  Judgement:  Intact  Insight:  Present  Psychomotor Activity:  NA  Concentration:  Concentration: Good and Attention Span: Good  Recall:  Good  Fund of Knowledge:  Good  Language:  Good  Akathisia:  No  Handed:  Right  AIMS (if indicated):     Assets:  Communication Skills Desire for Improvement Housing Social Support Talents/Skills Transportation  ADL's:  Intact  Cognition:  WNL  Sleep:   fair      Assessment and Plan: Anxiety with depression.  Patient is no longer taking Zoloft.  He like to cope better without any medication.  He is also stopped taking his acid reflux medication, methimazole.  He agree to follow  up in 4 months however if needed he can call us earlier.  At this time patient has no suicidal thoughts, hallucination, paranoia and we agree to keep him off from the medication unless he needed.  Follow-up in 4 months.   Follow Up Instructions:    I discussed the assessment and treatment plan with the patient. The patient was provided an opportunity to ask questions and all were answered. The patient agreed with the plan and demonstrated an understanding of the instructions.   The patient was advised to call back or seek an in-person evaluation if the symptoms worsen or if the condition fails to improve as anticipated.  I provided 20 minutes of non-face-to-face time during this encounter.   Cleotis Nipper, MD

## 2021-10-12 NOTE — Telephone Encounter (Signed)
Na

## 2021-12-19 ENCOUNTER — Encounter (HOSPITAL_COMMUNITY): Payer: Self-pay | Admitting: Psychiatry

## 2021-12-19 ENCOUNTER — Telehealth (HOSPITAL_BASED_OUTPATIENT_CLINIC_OR_DEPARTMENT_OTHER): Payer: Self-pay | Admitting: Psychiatry

## 2021-12-19 VITALS — Wt 198.0 lb

## 2021-12-19 DIAGNOSIS — F418 Other specified anxiety disorders: Secondary | ICD-10-CM

## 2021-12-19 MED ORDER — SERTRALINE HCL 25 MG PO TABS: 25.0000 mg | ORAL_TABLET | Freq: Two times a day (BID) | ORAL | 1 refills | Status: DC

## 2021-12-19 NOTE — Progress Notes (Signed)
Virtual Visit via Telephone Note  I connected with Patrick Duffy on 12/19/21 at  4:00 PM EDT by telephone and verified that I am speaking with the correct person using two identifiers.  Location: Patient: Work Provider: Biomedical scientist   I discussed the limitations, risks, security and privacy concerns of performing an evaluation and management service by telephone and the availability of in person appointments. I also discussed with the patient that there may be a patient responsible charge related to this service. The patient expressed understanding and agreed to proceed.   History of Present Illness: Patient is evaluated by phone session.  He reported a lot of stress from the family.  His brother had a legal case against him. He is disappointed what brother has done to him and currently he is not able to see his mother as as frequently as he like.  His mother is staying at his house but his younger brother claim the house and he cannot visit there anymore until it settles in the court.  He is talking to his mother on the phone.  His mother is very sick and now uses oxygen daily to help with her breathing.  He also reported these family matters causing significant impact in his business.  He is not able to spend time at work until recently he started going to work every day.  Patient has Rug business.  Recently furniture market happened and he tried the new product which he is hoping to work.  However he reported since his down.  He is spending too much time handling his legal case.  He notes some time not sleeping well and feels unmotivated to do things.  He stopped exercising and not taking medication.  He also stopped his methimazole.  His energy level is fair.  He is not taking any hypnotics or sleep medication.  His wife currently visiting [place] since July after legal matters started with a brother.  Patient is hoping she will return in December.  Denies any hallucination, paranoia, anger, suicidal  thoughts.  His major goal is to restore his business and to come out from the legal issues.  His next court date is on number 14th.  Patient denies drinking or using any illegal substances.  His support is his wife and friends.  He has 2 sisters and they live close by.  They are very supportive.  Past Psychiatric History: Reviewed. H/O depression and anxiety and self medicating with hypnotics and required in patient at [facility] in 2011. Took Seroquel, doxepin, trazodone , Cymbalta , Wellbutrin, lorazepam, trazodone, Ambien for insomnia. H/O Xanax abuse and agreed for treatment in [place] in 2010 but left AMA after 24-48 hours. H/O sleepwalking with Ambien and also questionable seizures due to withdrawal of benzodiazepine.  No h/o suicidal attempt, psychosis or mania.      Psychiatric Specialty Exam: Physical Exam  Review of Systems  Weight 198 lb (89.8 kg).There is no height or weight on file to calculate BMI.  General Appearance: NA  Eye Contact:  NA  Speech:  Slow  Volume:  Decreased  Mood:  Dysphoric  Affect:  NA  Thought Process:  Goal Directed  Orientation:  Full (Time, Place, and Person)  Thought Content:  Rumination  Suicidal Thoughts:  No  Homicidal Thoughts:  No  Memory:  Immediate;   Good Recent;   Good Remote;   Good  Judgement:  Intact  Insight:  Shallow  Psychomotor Activity:  NA  Concentration:  Concentration: Good  and Attention Span: Good  Recall:  Good  Fund of Knowledge:  Good  Language:  Good  Akathisia:  NA  Handed:  Right  AIMS (if indicated):     Assets:  Communication Skills Desire for Improvement Housing  ADL's:  Intact  Cognition:  WNL  Sleep:   fair      Assessment and Plan: Anxiety with depression.  I discussed restarting low-dose Zoloft to help with anxiety and depression.  Discussed family stress, legal issues and struggling to restore his business.  I also recommend should restart his methimazole which he has been not taking for a while.  Patient  agreed to consider going back on medication.  We will provide Zoloft 25 mg twice a day.  Discussed medication side effects and benefits.  Discussed therapy but at this time patient like to focus on his business and family matters by himself.  Discussed safety concerns and anytime having active suicidal thoughts or homicidal thought any need to call 911 or go to local emergency room.  Patient like to have follow-up in 6 months but promised to give [place] a call back if he need an earlier appointment.  Follow Up Instructions:    I discussed the assessment and treatment plan with the patient. The patient was provided an opportunity to ask questions and all were answered. The patient agreed with the plan and demonstrated an understanding of the instructions.   The patient was advised to call back or seek an in-person evaluation if the symptoms worsen or if the condition fails to improve as anticipated.  Collaboration of Care: Other provider involved in patient's care AEB notes are available in epic to review.  Patient/Guardian was advised Release of Information must be obtained prior to any record release in order to collaborate their care with an outside provider. Patient/Guardian was advised if they have not already done so to contact the registration department to sign all necessary forms in order for [place] to release information regarding their care.   Consent: Patient/Guardian gives verbal consent for treatment and assignment of benefits for services provided during this visit. Patient/Guardian expressed understanding and agreed to proceed.    I provided 25 minutes of non-face-to-face time during this encounter.   Cleotis Nipper, MD

## 2022-06-19 ENCOUNTER — Telehealth (HOSPITAL_COMMUNITY): Payer: Self-pay | Admitting: Psychiatry

## 2022-06-26 ENCOUNTER — Encounter (HOSPITAL_COMMUNITY): Payer: Self-pay | Admitting: Psychiatry

## 2022-06-26 ENCOUNTER — Telehealth (HOSPITAL_BASED_OUTPATIENT_CLINIC_OR_DEPARTMENT_OTHER): Payer: Self-pay | Admitting: Psychiatry

## 2022-06-26 DIAGNOSIS — F418 Other specified anxiety disorders: Secondary | ICD-10-CM

## 2022-06-26 MED ORDER — SERTRALINE HCL 25 MG PO TABS: 25.0000 mg | ORAL_TABLET | Freq: Two times a day (BID) | ORAL | 1 refills | Status: DC

## 2022-06-26 NOTE — Progress Notes (Signed)
[facility] MD Virtual Progress Note   Patient Location: Home Provider Location: Home Office  I connect with patient by telephone and verified that I am speaking with correct person by using two identifiers. I discussed the limitations of evaluation and management by telemedicine and the availability of in person appointments. I also discussed with the patient that there may be a patient responsible charge related to this service. The patient expressed understanding and agreed to proceed.  Patrick Duffy [id number] 57 y.o.  06/26/2022 11:26 AM  History of Present Illness:  He is evaluated by phone session.  He reported family stress and not able to work as much because spending time with mother who has diagnosed with ALS and deteriorating.  He is also not happy with the brother who is continued to cause a lot of issues and living in the basement.  His court cases are not settled yet and he has court appearances and next few months.  He has 4 open cases which was charged by his brother.  Patient told few days ago his younger brother called police again after the argument and claimed that he was threatening.  Patient told when the police came in the evening his mother saw and it was very disturbing.  He is trying to spend more time with the mother who requires oxygen and help for her ADLs.  Patient reported feeling tired, not getting sometime enough sleep and sometime next day does not go to work.  Patient has a well-established right business but he noticed things are not going very well because he is not present there most of the time.  Denies any crying spells or any feeling of hopelessness or worthlessness.  He is taking thyroid medicine methimazole.  Patient told his wife is in [place] and he does not want to bring her back because there is so much going on in the family.  Denies drinking or using any illegal substances.  He is taking sertraline 25 mg 2 times a day which is helping his  depression and anxiety but realized he had a lot of family stress.  He does not have any hallucination or any suicidal thoughts.  He is not interested in therapy.  He has 2 sister who live close by and they are very supportive.  Patient denies any agitation, anger or violence.  He denies drinking or using any illegal substances.  His appetite is okay.  His weight is stable.  Past Psychiatric History: H/O depression and anxiety and self medicating with hypnotics and required in patient at [facility] in 2011. Took Seroquel, doxepin, trazodone , Cymbalta , Wellbutrin, lorazepam, trazodone, Ambien for insomnia. H/O Xanax abuse and agreed for treatment in [place] in 2010 but left AMA after 24-48 hours. H/O sleepwalking with Ambien and also questionable seizures due to withdrawal of benzodiazepine.  No h/o suicidal attempt, psychosis or mania.       Outpatient Encounter Medications as of 06/26/2022  Medication Sig   acetaminophen (TYLENOL) 500 MG tablet Take 500-1,000 mg by mouth every 8 (eight) hours as needed for mild pain.   aspirin-sod bicarb-citric acid (ALKA-SELTZER) 325 MG TBEF tablet Take 325 mg by mouth every 6 (six) hours as needed (for cold symptoms).   Esomeprazole Magnesium 20 MG TBEC Take by mouth. (Patient not taking: Reported on 08/20/2021)   fluticasone (CUTIVATE) 0.05 % cream SMARTSIG:Topical 1 to 2 Times Daily PRN   methimazole (TAPAZOLE) 10 MG tablet Take 1 tablet (10 mg total) by mouth 2 (  two) times daily. (Patient not taking: Reported on 08/20/2021)   sertraline (ZOLOFT) 25 MG tablet Take 1 tablet (25 mg total) by mouth 2 (two) times daily.   tadalafil (CIALIS) 20 MG tablet Take 1 tablet (20 mg total) by mouth daily as needed for erectile dysfunction.   triamcinolone cream (KENALOG) 0.1 % 1 application to affected area   Vitamin D, Ergocalciferol, (DRISDOL) 1.25 MG (50000 UNIT) CAPS capsule Take 1 capsule by mouth weekly for 6 months and then switch to Vit D 5000U OTC daily thereafter (Patient not  taking: Reported on 08/20/2021)   zolpidem (AMBIEN) 10 MG tablet Take 1 tablet (10 mg total) by mouth at bedtime. (Patient not taking: Reported on 08/20/2021)   No facility-administered encounter medications on file as of 06/26/2022.    No results found for this or any previous visit (from the past 2160 hour(s)).   Psychiatric Specialty Exam: Physical Exam  Review of Systems  There were no vitals taken for this visit.There is no height or weight on file to calculate BMI.  General Appearance: NA  Eye Contact:  NA  Speech:  Slow  Volume:  Decreased  Mood:  Dysphoric and tired  Affect:  NA  Thought Process:  Descriptions of Associations: Intact  Orientation:  Full (Time, Place, and Person)  Thought Content:  Rumination  Suicidal Thoughts:  No  Homicidal Thoughts:  No  Memory:  Immediate;   Good Recent;   Good Remote;   Good  Judgement:  Intact  Insight:  Present  Psychomotor Activity:  NA  Concentration:  Concentration: Good and Attention Span: Good  Recall:  Good  Fund of Knowledge:  Good  Language:  Good  Akathisia:  No  Handed:  Right  AIMS (if indicated):     Assets:  Communication Skills Desire for Improvement Housing Resilience Social Support Transportation  ADL's:  Intact  Cognition:  WNL  Sleep:  ok     Assessment/Plan: Anxiety associated with depression - Plan: sertraline (ZOLOFT) 25 MG tablet  Discussed family situation.  His brother has charged him for 4 cases which are still open.  His brother lives in his basement and he had supported him for the past 17 years and he feels very distressed and also feels that whole family is very distressed about these cases.  He is trying to help his mother who is not doing very well and requires assistance for ADLs.  So far he is not having any side effects from Zoloft.  He like to keep the current medication.  He is not interested in therapy.  Recommend to call us back if is any question or any concern.  We will continue  Zoloft 25 mg 2 times a day.  Follow-up in 6 months.   Follow Up Instructions:     I discussed the assessment and treatment plan with the patient. The patient was provided an opportunity to ask questions and all were answered. The patient agreed with the plan and demonstrated an understanding of the instructions.   The patient was advised to call back or seek an in-person evaluation if the symptoms worsen or if the condition fails to improve as anticipated.    Collaboration of Care: Other provider involved in patient's care AEB notes are available in epic to review.  Patient/Guardian was advised Release of Information must be obtained prior to any record release in order to collaborate their care with an outside provider. Patient/Guardian was advised if they have not already done so to  contact the registration department to sign all necessary forms in order for [place] to release information regarding their care.   Consent: Patient/Guardian gives verbal consent for treatment and assignment of benefits for services provided during this visit. Patient/Guardian expressed understanding and agreed to proceed.     I provided 18 minutes of non face to face time during this encounter.  Note: This document was prepared by [employer] voice dictation technology and any errors that results from this process are unintentional.    Cleotis Nipper, MD 06/26/2022

## 2022-11-02 ENCOUNTER — Other Ambulatory Visit: Payer: Self-pay | Admitting: Internal Medicine

## 2022-11-02 MED ORDER — METHIMAZOLE 10 MG PO TABS
10.0000 mg | ORAL_TABLET | Freq: Three times a day (TID) | ORAL | 0 refills | Status: DC
Start: 2022-11-02 — End: 2022-12-05

## 2022-11-02 NOTE — Progress Notes (Signed)
Patrick Duffy has an appointment with me to establish care with [place] on Tuesday 11/05/22. He is running out of methimazole 10 mg three time a day by Sunday, he asked for guidance. I will send refill of methimazole as this is life saving medicine.

## 2022-11-05 ENCOUNTER — Encounter: Payer: Self-pay | Admitting: Internal Medicine

## 2022-11-05 ENCOUNTER — Ambulatory Visit: Payer: Self-pay | Admitting: Internal Medicine

## 2022-11-05 VITALS — BP 126/88 | HR 56 | Temp 97.8°F | Resp 18 | Ht 69.25 in | Wt 197.0 lb

## 2022-11-05 DIAGNOSIS — Z6828 Body mass index (BMI) 28.0-28.9, adult: Secondary | ICD-10-CM

## 2022-11-05 DIAGNOSIS — E0590 Thyrotoxicosis, unspecified without thyrotoxic crisis or storm: Secondary | ICD-10-CM

## 2022-11-05 DIAGNOSIS — M25512 Pain in left shoulder: Secondary | ICD-10-CM

## 2022-11-05 DIAGNOSIS — G8929 Other chronic pain: Secondary | ICD-10-CM | POA: Insufficient documentation

## 2022-11-05 DIAGNOSIS — M25511 Pain in right shoulder: Secondary | ICD-10-CM

## 2022-11-05 MED ORDER — OXYCODONE HCL 5 MG PO TABS: 5.0000 mg | ORAL_TABLET | Freq: Three times a day (TID) | ORAL | 0 refills | Status: DC | PRN

## 2022-11-05 NOTE — Progress Notes (Unsigned)
New Patient Office Visit  Subjective    Patient ID: Patrick Duffy, male    DOB: [birth year]  Age: 56 y.o. MRN: [id number]  CC:  Chief Complaint  Patient presents with  . New Patient (Initial Visit)    HPI Patrick Duffy presents to establish care With [place]. He has   Outpatient Encounter Medications as of 11/05/2022  Medication Sig  . acetaminophen (TYLENOL) 500 MG tablet Take 500-1,000 mg by mouth every 8 (eight) hours as needed for mild pain.  Marland Kitchen aspirin-sod bicarb-citric acid (ALKA-SELTZER) 325 MG TBEF tablet Take 325 mg by mouth every 6 (six) hours as needed (for cold symptoms).  . fluticasone (CUTIVATE) 0.05 % cream SMARTSIG:Topical 1 to 2 Times Daily PRN  . methimazole (TAPAZOLE) 10 MG tablet Take 1 tablet (10 mg total) by mouth 3 (three) times daily.  . sertraline (ZOLOFT) 25 MG tablet Take 1 tablet (25 mg total) by mouth 2 (two) times daily.  Marland Kitchen triamcinolone cream (KENALOG) 0.1 % 1 application to affected area  . Vitamin D, Ergocalciferol, (DRISDOL) 1.25 MG (50000 UNIT) CAPS capsule Take 1 capsule by mouth weekly for 6 months and then switch to Vit D 5000U OTC daily thereafter (Patient not taking: Reported on 08/20/2021)  . [DISCONTINUED] tadalafil (CIALIS) 20 MG tablet Take 1 tablet (20 mg total) by mouth daily as needed for erectile dysfunction.   No facility-administered encounter medications on file as of 11/05/2022.    Past Medical History:  Diagnosis Date  . HTN (hypertension)     No past surgical history on file.  Family History  Problem Relation Age of Onset  . Thyroid disease Neg Hx     Social History   Socioeconomic History  . Marital status: Married    Spouse name: Not on file  . Number of children: Not on file  . Years of education: Not on file  . Highest education level: Not on file  Occupational History  . Not on file  Tobacco Use  . Smoking status: Some Days    Types: Cigarettes  . Smokeless tobacco: Never  Vaping Use  . Vaping status: Never Used   Substance and Sexual Activity  . Alcohol use: No  . Drug use: No  . Sexual activity: Yes  Other Topics Concern  . Not on file  Social History Narrative  . Not on file   Social Determinants of Health   Financial Resource Strain: Not on file  Food Insecurity: Not on file  Transportation Needs: Not on file  Physical Activity: Not on file  Stress: Not on file  Social Connections: Not on file  Intimate Partner Violence: Not on file    ROS      Objective    BP 126/88 (BP Location: Left Arm, Patient Position: Sitting, Cuff Size: Normal)   Pulse (!) 56   Temp 97.8 F (36.6 C)   Resp 18   Ht 5' 9.25" (1.759 m)   Wt 197 lb (89.4 kg)   SpO2 98%   BMI 28.88 kg/m   Physical Exam  {Labs (Optional):23779}    Assessment & Plan:   Problem List Items Addressed This Visit   None   Return in about 1 month (around 12/05/2022).   Eloisa Northern, MD

## 2022-11-07 NOTE — Assessment & Plan Note (Signed)
I will send oxycodone for pain control and he will need to follow-up with pain clinic that I will discuss with next visit.

## 2022-11-07 NOTE — Assessment & Plan Note (Signed)
He take methimazole 10 mg 3 times a day.  I will do TSH, free T4 and total T3.

## 2022-11-30 ENCOUNTER — Other Ambulatory Visit: Payer: Self-pay | Admitting: Internal Medicine

## 2022-12-05 ENCOUNTER — Other Ambulatory Visit: Payer: Self-pay | Admitting: Internal Medicine

## 2022-12-05 MED ORDER — METHIMAZOLE 10 MG PO TABS: 10.0000 mg | ORAL_TABLET | Freq: Three times a day (TID) | ORAL | 5 refills | Status: DC

## 2022-12-06 ENCOUNTER — Ambulatory Visit: Payer: Self-pay | Admitting: Internal Medicine

## 2022-12-06 ENCOUNTER — Encounter: Payer: Self-pay | Admitting: Internal Medicine

## 2022-12-06 VITALS — BP 124/80 | HR 69 | Temp 98.3°F | Resp 18 | Ht 69.5 in | Wt 193.2 lb

## 2022-12-06 DIAGNOSIS — Z125 Encounter for screening for malignant neoplasm of prostate: Secondary | ICD-10-CM

## 2022-12-06 DIAGNOSIS — Z131 Encounter for screening for diabetes mellitus: Secondary | ICD-10-CM

## 2022-12-06 DIAGNOSIS — M549 Dorsalgia, unspecified: Secondary | ICD-10-CM

## 2022-12-06 DIAGNOSIS — G8929 Other chronic pain: Secondary | ICD-10-CM

## 2022-12-06 DIAGNOSIS — M25511 Pain in right shoulder: Secondary | ICD-10-CM

## 2022-12-06 DIAGNOSIS — T8089XD Other complications following infusion, transfusion and therapeutic injection, subsequent encounter: Secondary | ICD-10-CM

## 2022-12-06 DIAGNOSIS — E0590 Thyrotoxicosis, unspecified without thyrotoxic crisis or storm: Secondary | ICD-10-CM

## 2022-12-06 DIAGNOSIS — M25512 Pain in left shoulder: Secondary | ICD-10-CM

## 2022-12-06 DIAGNOSIS — M545 Low back pain, unspecified: Secondary | ICD-10-CM

## 2022-12-06 DIAGNOSIS — Z5181 Encounter for therapeutic drug level monitoring: Secondary | ICD-10-CM

## 2022-12-06 MED ORDER — OXYCODONE HCL 5 MG PO TABS: 5.0000 mg | ORAL_TABLET | Freq: Two times a day (BID) | ORAL | 0 refills | Status: AC | PRN

## 2022-12-06 NOTE — Addendum Note (Signed)
Addended byRosalie Doctor on: 12/06/2022 03:27 PM   Modules accepted: Orders

## 2022-12-06 NOTE — Progress Notes (Addendum)
Office Visit  Subjective   Patient ID: Patrick Duffy   DOB: 06-01-1965   Age: 57 y.o.   MRN: [id number]   Chief Complaint Chief Complaint  Patient presents with   Office visit     History of Present Illness 57 years old male is here for follow up of pain. He has bilateral shoulder pain due to rotator cuff tear.  He says that he was advised surgery but he cannot afford recovery time because of his work.  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  He also keep regular exercise to strengthen his muscles and after that heavy exercise pain also get worse.    He also has chronic back pain with radiculopathy.  He attributes this pain by lifting and picking up heavy carpets in his work.  He says that he has been using narcotic for a while and used to get this medication from [place].  Last time I had seen him and I have given him pain medications and advised him about pain clinic.  Again I have discussed with him about the role of narcotic, danger about using chronic narcotic for chronic pain with really no proven benefit.  I have also discussed with him about cutting back on his pain medication and suggested that I will refer him to pain clinic.  He has signed narcotic contract until he goes to pain clinic.  He described his back pain as moderate in intensity, nonradiating and he cannot sleep at nighttime because of pain.  He also also has right posterior leg that hurt not sure , how this pain started.  Pain also get worse with activity.  He has hypothyroidism and has been taking methimazole and I gave him prescription for blood drawn but he said that he could not go.  He says that, he will go today and have labs done.   Past Medical History Past Medical History:  Diagnosis Date   HTN (hypertension)      Allergies Allergies  Allergen Reactions   Trazodone And Nefazodone Other (See Comments)    Caused the patient to be groggy the next morning     Other Dermatitis and Rash    DYES (clothing)     Review of Systems Review of Systems  Constitutional: Negative.   HENT: Negative.    Respiratory: Negative.    Cardiovascular: Negative.   Gastrointestinal: Negative.   Musculoskeletal:  Positive for back pain and joint pain.  Neurological: Negative.        Objective:    Vitals BP 124/80 (BP Location: Left Arm, Patient Position: Sitting, Cuff Size: Normal)   Pulse 69   Temp 98.3 F (36.8 C)   Resp 18   Ht 5' 9.5" (1.765 m)   Wt 193 lb 4 oz (87.7 kg)   SpO2 98%   BMI 28.13 kg/m    Physical Examination Physical Exam Constitutional:      Appearance: Normal appearance.  Cardiovascular:     Rate and Rhythm: Normal rate and regular rhythm.     Heart sounds: Normal heart sounds.  Pulmonary:     Effort: Pulmonary effort is normal.     Breath sounds: Normal breath sounds.  Neurological:     General: No focal deficit present.     Mental Status: He is alert and oriented to person, place, and time.        Assessment & Plan:   Hyperthyroidism Sinew take methimazole 3  times a day.  I have given him prescription for the labs drawn that include CBC, CMP, TSH, free T4 and total T3.  Chronic bilateral low back pain without sciatica He will try to reduce Percocet not to take more than 2 tablets a day and will try to wean that off over 2 to 3 months months.  He will take Tylenol as a first-line, if needed then he will add anti-inflammatory medicine and if he still hurt then he will take Percocet.  I will also refer him to pain clinic.  Chronic pain of both shoulders Will continue with pain as mentioned above and he will discuss with pain clinic for possible injection and physical therapy.  Screening for diabetes mellitus (DM) I will do hemoglobin A1c for diabetes screening.    Return in about 6 months (around 06/06/2023).   Eloisa Northern, MD

## 2022-12-06 NOTE — Assessment & Plan Note (Signed)
I will refer him to see pain management Dr. Welton Flakes

## 2022-12-18 DIAGNOSIS — M545 Low back pain, unspecified: Secondary | ICD-10-CM | POA: Insufficient documentation

## 2022-12-18 NOTE — Assessment & Plan Note (Signed)
I will do hemoglobin A1c for diabetes screening.

## 2022-12-18 NOTE — Assessment & Plan Note (Signed)
He will try to reduce Percocet not to take more than 2 tablets a day and will try to wean that off over 2 to 3 months months.  He will take Tylenol as a first-line, if needed then he will add anti-inflammatory medicine and if he still hurt then he will take Percocet.  I will also refer him to pain clinic.

## 2022-12-18 NOTE — Assessment & Plan Note (Signed)
Will continue with pain as mentioned above and he will discuss with pain clinic for possible injection and physical therapy.

## 2022-12-18 NOTE — Assessment & Plan Note (Signed)
Sinew take methimazole 3 times a day.  I have given him prescription for the labs drawn that include CBC, CMP, TSH, free T4 and total T3.

## 2022-12-26 ENCOUNTER — Encounter (HOSPITAL_COMMUNITY): Payer: Self-pay | Admitting: Psychiatry

## 2022-12-26 ENCOUNTER — Telehealth (HOSPITAL_BASED_OUTPATIENT_CLINIC_OR_DEPARTMENT_OTHER): Payer: Self-pay | Admitting: Psychiatry

## 2022-12-26 DIAGNOSIS — F418 Other specified anxiety disorders: Secondary | ICD-10-CM

## 2022-12-26 MED ORDER — SERTRALINE HCL 25 MG PO TABS: 25.0000 mg | ORAL_TABLET | Freq: Two times a day (BID) | ORAL | 1 refills | Status: AC

## 2022-12-26 NOTE — Progress Notes (Signed)
[facility] MD Virtual Progress Note   Patient Location: Home Provider Location: Office  I connect with patient by video and verified that I am speaking with correct person by using two identifiers. I discussed the limitations of evaluation and management by telemedicine and the availability of in person appointments. I also discussed with the patient that there may be a patient responsible charge related to this service. The patient expressed understanding and agreed to proceed.  Patrick Duffy [id number] 57 y.o.  12/26/2022 10:04 AM  History of Present Illness:  Patient is evaluated by video session.  Patient told mother deceased months ago after suffering from ALS.  Patient told it was a very difficult time but he feels that she is at least not in misery.  Patient continues to have issues with his younger brother.  He is now demanding $190,000 and then he will leave the house.  Patient told it is still not sure that he will stop bothering him in the future.  Patient continues to engage in court hearing.  He has to open case.  Patient feels after the death of the mother siblings are not as much involved in his daily life.  [place] in [place].  Patient told he had a back pain and shoulder pain and recently given pain medication but he stopped now.  Denies any crying spells or any feeling of hopelessness.  He denies any suicidal thoughts.  He is taking sertraline 25 mg twice a day.  He has no tremors, shakes or any EPS.  He reported business is better than before.  He admitted sometimes frustration but no anger.  Sometimes he will wake up.  He also reported due to time change sleeping early and waking up early.  He has no tremors, shakes or any EPS.  His appetite is okay.  He gained weight because he is not active but like to restart exercise.  Past Psychiatric History: H/O depression and anxiety and self medicating with hypnotics and required in patient at [facility] in 2011. Took  Seroquel, doxepin, trazodone , Cymbalta , Wellbutrin, lorazepam, trazodone, Ambien for insomnia. H/O Xanax abuse and agreed for treatment in [place] in 2010 but left AMA after 24-48 hours. H/O sleepwalking with Ambien and also questionable seizures due to withdrawal of benzodiazepine.  No h/o suicidal attempt, psychosis or mania.      Outpatient Encounter Medications as of 12/26/2022  Medication Sig   acetaminophen (TYLENOL) 500 MG tablet Take 500-1,000 mg by mouth every 8 (eight) hours as needed for mild pain.   aspirin-sod bicarb-citric acid (ALKA-SELTZER) 325 MG TBEF tablet Take 325 mg by mouth every 6 (six) hours as needed (for cold symptoms).   fluticasone (CUTIVATE) 0.05 % cream SMARTSIG:Topical 1 to 2 Times Daily PRN   methimazole (TAPAZOLE) 10 MG tablet Take 1 tablet (10 mg total) by mouth 3 (three) times daily.   oxyCODONE (ROXICODONE) 5 MG immediate release tablet Take 1 tablet (5 mg total) by mouth 2 (two) times daily between meals as needed.   sertraline (ZOLOFT) 25 MG tablet Take 1 tablet (25 mg total) by mouth 2 (two) times daily.   triamcinolone cream (KENALOG) 0.1 % 1 application to affected area   Vitamin D, Ergocalciferol, (DRISDOL) 1.25 MG (50000 UNIT) CAPS capsule Take 1 capsule by mouth weekly for 6 months and then switch to Vit D 5000U OTC daily thereafter   No facility-administered encounter medications on file as of 12/26/2022.    No results found for  this or any previous visit (from the past 2160 hour(s)).   Psychiatric Specialty Exam: Physical Exam  Review of Systems  Weight 198 lb (89.8 kg).There is no height or weight on file to calculate BMI.  General Appearance: Casual  Eye Contact:  Good  Speech:  Normal Rate  Volume:  Normal  Mood:  Anxious  Affect:  Congruent  Thought Process:  Goal Directed  Orientation:  Full (Time, Place, and Person)  Thought Content:  Logical  Suicidal Thoughts:  No  Homicidal Thoughts:  No  Memory:  Immediate;   Good Recent;    Good Remote;   Good  Judgement:  Good  Insight:  Good  Psychomotor Activity:  Normal  Concentration:  Concentration: Good and Attention Span: Good  Recall:  Good  Fund of Knowledge:  Good  Language:  Good  Akathisia:  No  Handed:  Right  AIMS (if indicated):     Assets:  Communication Skills Desire for Improvement Housing Talents/Skills Transportation  ADL's:  Intact  Cognition:  WNL  Sleep:  fair     Assessment/Plan: Anxiety associated with depression - Plan: sertraline (ZOLOFT) 25 MG tablet  Discussed the loss of mother and grief.  He continued to have family stress from his younger brother.  He has still 2 open cases which he has been dealing with it.  His  brother demanded a lot of money before he will leave Basement of the house.  Patient is working to negotiate but again he is not sure if he will agree to leave the house.  Patient does not want to change the medication.  We will continue Zoloft 25 mg twice a day.  He is not interested in therapy.  I reviewed current medication.  He is no longer taking pain medicine.  I recommend to call us back if he has any question or any concern.  Follow-up in 6 months.  Brief psychotherapy given about family stress and relaxing technique and deep breathing.      Follow Up Instructions:     I discussed the assessment and treatment plan with the patient. The patient was provided an opportunity to ask questions and all were answered. The patient agreed with the plan and demonstrated an understanding of the instructions.   The patient was advised to call back or seek an in-person evaluation if the symptoms worsen or if the condition fails to improve as anticipated.    Collaboration of Care: Other provider involved in patient's care AEB notes are available in epic to review  Patient/Guardian was advised Release of Information must be obtained prior to any record release in order to collaborate their care with an outside provider.  Patient/Guardian was advised if they have not already done so to contact the registration department to sign all necessary forms in order for [place] to release information regarding their care.   Consent: Patient/Guardian gives verbal consent for treatment and assignment of benefits for services provided during this visit. Patient/Guardian expressed understanding and agreed to proceed.     I provided 26 minutes of non face to face time during this encounter.  Note: This document was prepared by [employer] voice dictation technology and any errors that results from this process are unintentional.    Cleotis Nipper, MD 12/26/2022

## 2022-12-30 ENCOUNTER — Telehealth (HOSPITAL_COMMUNITY): Payer: Self-pay | Admitting: Psychiatry

## 2023-05-17 ENCOUNTER — Other Ambulatory Visit: Payer: Self-pay | Admitting: Internal Medicine

## 2023-05-17 MED ORDER — AZITHROMYCIN 250 MG PO TABS: ORAL_TABLET | ORAL | 0 refills | Status: AC

## 2023-05-17 MED ORDER — METHYLPREDNISOLONE 4 MG PO TBPK: ORAL_TABLET | ORAL | 0 refills | Status: AC

## 2023-06-23 ENCOUNTER — Telehealth (HOSPITAL_BASED_OUTPATIENT_CLINIC_OR_DEPARTMENT_OTHER): Payer: Self-pay | Admitting: Psychiatry

## 2023-06-23 ENCOUNTER — Encounter (HOSPITAL_COMMUNITY): Payer: Self-pay

## 2023-06-23 DIAGNOSIS — Z91199 Patient's noncompliance with other medical treatment and regimen due to unspecified reason: Secondary | ICD-10-CM

## 2023-06-23 NOTE — Progress Notes (Signed)
 Patient is no-show today.  Will send a letter to schedule appointment.

## 2023-07-02 ENCOUNTER — Other Ambulatory Visit: Payer: Self-pay | Admitting: Internal Medicine

## 2023-07-02 MED ORDER — KETOCONAZOLE 2 % EX SHAM: 1.0000 | MEDICATED_SHAMPOO | CUTANEOUS | 0 refills | Status: DC

## 2023-07-02 MED ORDER — METHIMAZOLE 10 MG PO TABS: 10.0000 mg | ORAL_TABLET | Freq: Three times a day (TID) | ORAL | 5 refills | Status: AC

## 2023-07-02 NOTE — Progress Notes (Unsigned)
 Patrick Duffy

## 2024-01-14 ENCOUNTER — Other Ambulatory Visit: Payer: Self-pay | Admitting: Internal Medicine

## 2024-01-14 MED ORDER — KETOCONAZOLE 2 % EX SHAM: 1.0000 | MEDICATED_SHAMPOO | CUTANEOUS | 0 refills | Status: AC
